# Patient Record
Sex: Male | Born: 1980 | Race: White | Hispanic: No | Marital: Single | Smoking: Former smoker
Health system: Southern US, Community
[De-identification: ages and names within clinical notes are randomized; demographics above are authoritative.]

## PROBLEM LIST (undated history)

## (undated) DIAGNOSIS — I10 Essential (primary) hypertension: Secondary | ICD-10-CM

## (undated) DIAGNOSIS — J4550 Severe persistent asthma, uncomplicated: Secondary | ICD-10-CM

## (undated) DIAGNOSIS — G4733 Obstructive sleep apnea (adult) (pediatric): Secondary | ICD-10-CM

## (undated) DIAGNOSIS — Z973 Presence of spectacles and contact lenses: Secondary | ICD-10-CM

## (undated) DIAGNOSIS — S83206A Unspecified tear of unspecified meniscus, current injury, right knee, initial encounter: Secondary | ICD-10-CM

## (undated) DIAGNOSIS — Z974 Presence of external hearing-aid: Secondary | ICD-10-CM

## (undated) DIAGNOSIS — J45909 Unspecified asthma, uncomplicated: Secondary | ICD-10-CM

## (undated) HISTORY — PX: WISDOM TOOTH EXTRACTION: SHX21

## (undated) HISTORY — PX: NO PAST SURGERIES: SHX2092

---
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
## 2019-04-08 ENCOUNTER — Other Ambulatory Visit: Payer: Self-pay

## 2019-04-08 DIAGNOSIS — Z20822 Contact with and (suspected) exposure to covid-19: Secondary | ICD-10-CM

## 2019-04-11 LAB — NOVEL CORONAVIRUS, NAA: SARS-CoV-2, NAA: NOT DETECTED

## 2019-04-12 ENCOUNTER — Telehealth: Payer: Self-pay | Admitting: General Practice

## 2019-04-12 NOTE — Telephone Encounter (Signed)
Negative COVID results given. Patient results "NOT Detected." Caller expressed understanding. ° °

## 2019-08-17 ENCOUNTER — Inpatient Hospital Stay (HOSPITAL_COMMUNITY): Payer: Managed Care, Other (non HMO)

## 2019-08-17 ENCOUNTER — Encounter (HOSPITAL_COMMUNITY): Payer: Self-pay

## 2019-08-17 ENCOUNTER — Emergency Department (HOSPITAL_COMMUNITY): Payer: Managed Care, Other (non HMO)

## 2019-08-17 ENCOUNTER — Inpatient Hospital Stay (HOSPITAL_COMMUNITY)
Admission: EM | Admit: 2019-08-17 | Discharge: 2019-08-23 | DRG: 202 | Disposition: A | Discharge status: Home / Self Care | Payer: Managed Care, Other (non HMO) | Attending: Internal Medicine | Admitting: Internal Medicine

## 2019-08-17 ENCOUNTER — Other Ambulatory Visit: Payer: Self-pay

## 2019-08-17 DIAGNOSIS — J189 Pneumonia, unspecified organism: Secondary | ICD-10-CM | POA: Diagnosis present

## 2019-08-17 DIAGNOSIS — R0602 Shortness of breath: Secondary | ICD-10-CM

## 2019-08-17 DIAGNOSIS — Z8709 Personal history of other diseases of the respiratory system: Secondary | ICD-10-CM

## 2019-08-17 DIAGNOSIS — Z6841 Body Mass Index (BMI) 40.0 and over, adult: Secondary | ICD-10-CM | POA: Diagnosis not present

## 2019-08-17 DIAGNOSIS — E873 Alkalosis: Secondary | ICD-10-CM | POA: Diagnosis present

## 2019-08-17 DIAGNOSIS — Z7951 Long term (current) use of inhaled steroids: Secondary | ICD-10-CM

## 2019-08-17 DIAGNOSIS — J4552 Severe persistent asthma with status asthmaticus: Principal | ICD-10-CM | POA: Diagnosis present

## 2019-08-17 DIAGNOSIS — Z87891 Personal history of nicotine dependence: Secondary | ICD-10-CM

## 2019-08-17 DIAGNOSIS — Z79899 Other long term (current) drug therapy: Secondary | ICD-10-CM | POA: Diagnosis not present

## 2019-08-17 DIAGNOSIS — Z8679 Personal history of other diseases of the circulatory system: Secondary | ICD-10-CM

## 2019-08-17 DIAGNOSIS — J9621 Acute and chronic respiratory failure with hypoxia: Secondary | ICD-10-CM | POA: Diagnosis present

## 2019-08-17 DIAGNOSIS — I878 Other specified disorders of veins: Secondary | ICD-10-CM | POA: Diagnosis present

## 2019-08-17 DIAGNOSIS — E662 Morbid (severe) obesity with alveolar hypoventilation: Secondary | ICD-10-CM | POA: Diagnosis present

## 2019-08-17 DIAGNOSIS — Z20822 Contact with and (suspected) exposure to covid-19: Secondary | ICD-10-CM | POA: Diagnosis present

## 2019-08-17 DIAGNOSIS — J9622 Acute and chronic respiratory failure with hypercapnia: Secondary | ICD-10-CM | POA: Diagnosis present

## 2019-08-17 DIAGNOSIS — I10 Essential (primary) hypertension: Secondary | ICD-10-CM | POA: Diagnosis present

## 2019-08-17 DIAGNOSIS — J9601 Acute respiratory failure with hypoxia: Secondary | ICD-10-CM | POA: Diagnosis not present

## 2019-08-17 DIAGNOSIS — J45909 Unspecified asthma, uncomplicated: Secondary | ICD-10-CM | POA: Diagnosis not present

## 2019-08-17 DIAGNOSIS — E669 Obesity, unspecified: Secondary | ICD-10-CM | POA: Diagnosis present

## 2019-08-17 DIAGNOSIS — E872 Acidosis: Secondary | ICD-10-CM | POA: Diagnosis present

## 2019-08-17 DIAGNOSIS — D751 Secondary polycythemia: Secondary | ICD-10-CM | POA: Diagnosis present

## 2019-08-17 DIAGNOSIS — I2781 Cor pulmonale (chronic): Secondary | ICD-10-CM | POA: Diagnosis present

## 2019-08-17 HISTORY — DX: Personal history of other diseases of the respiratory system: Z87.09

## 2019-08-17 HISTORY — DX: Essential (primary) hypertension: I10

## 2019-08-17 HISTORY — DX: Unspecified asthma, uncomplicated: J45.909

## 2019-08-17 HISTORY — DX: Personal history of other diseases of the circulatory system: Z86.79

## 2019-08-17 LAB — D-DIMER, QUANTITATIVE: D-Dimer, Quant: 0.47 ug/mL-FEU (ref 0.00–0.50)

## 2019-08-17 LAB — BLOOD GAS, ARTERIAL
Acid-Base Excess: 8.4 mmol/L — ABNORMAL HIGH (ref 0.0–2.0)
Bicarbonate: 38.7 mmol/L — ABNORMAL HIGH (ref 20.0–28.0)
Drawn by: 270211
FIO2: 44
O2 Saturation: 94.7 %
Patient temperature: 98.6
pCO2 arterial: 78 mmHg (ref 32.0–48.0)
pH, Arterial: 7.317 — ABNORMAL LOW (ref 7.350–7.450)
pO2, Arterial: 79.5 mmHg — ABNORMAL LOW (ref 83.0–108.0)

## 2019-08-17 LAB — CBC WITH DIFFERENTIAL/PLATELET
Abs Immature Granulocytes: 0.09 10*3/uL — ABNORMAL HIGH (ref 0.00–0.07)
Basophils Absolute: 0.1 10*3/uL (ref 0.0–0.1)
Basophils Relative: 1 %
Eosinophils Absolute: 0 10*3/uL (ref 0.0–0.5)
Eosinophils Relative: 0 %
HCT: 57.6 % — ABNORMAL HIGH (ref 39.0–52.0)
Hemoglobin: 17.6 g/dL — ABNORMAL HIGH (ref 13.0–17.0)
Immature Granulocytes: 1 %
Lymphocytes Relative: 16 %
Lymphs Abs: 1.8 10*3/uL (ref 0.7–4.0)
MCH: 33.7 pg (ref 26.0–34.0)
MCHC: 30.6 g/dL (ref 30.0–36.0)
MCV: 110.1 fL — ABNORMAL HIGH (ref 80.0–100.0)
Monocytes Absolute: 1.7 10*3/uL — ABNORMAL HIGH (ref 0.1–1.0)
Monocytes Relative: 15 %
Neutro Abs: 7.4 10*3/uL (ref 1.7–7.7)
Neutrophils Relative %: 67 %
Platelets: 189 10*3/uL (ref 150–400)
RBC: 5.23 MIL/uL (ref 4.22–5.81)
RDW: 15.5 % (ref 11.5–15.5)
WBC: 11 10*3/uL — ABNORMAL HIGH (ref 4.0–10.5)
nRBC: 1.5 % — ABNORMAL HIGH (ref 0.0–0.2)

## 2019-08-17 LAB — EXPECTORATED SPUTUM ASSESSMENT W GRAM STAIN, RFLX TO RESP C

## 2019-08-17 LAB — BRAIN NATRIURETIC PEPTIDE: B Natriuretic Peptide: 182.7 pg/mL — ABNORMAL HIGH (ref 0.0–100.0)

## 2019-08-17 LAB — HIV ANTIBODY (ROUTINE TESTING W REFLEX): HIV Screen 4th Generation wRfx: NONREACTIVE

## 2019-08-17 LAB — BASIC METABOLIC PANEL
Anion gap: 9 (ref 5–15)
BUN: 24 mg/dL — ABNORMAL HIGH (ref 6–20)
CO2: 34 mmol/L — ABNORMAL HIGH (ref 22–32)
Calcium: 9.3 mg/dL (ref 8.9–10.3)
Chloride: 94 mmol/L — ABNORMAL LOW (ref 98–111)
Creatinine, Ser: 1.11 mg/dL (ref 0.61–1.24)
GFR calc Af Amer: >60 mL/min (ref >60)
GFR calc non Af Amer: >60 mL/min (ref >60)
Glucose, Bld: 119 mg/dL — ABNORMAL HIGH (ref 70–99)
Potassium: 4.1 mmol/L (ref 3.5–5.1)
Sodium: 137 mmol/L (ref 135–145)

## 2019-08-17 LAB — POC SARS CORONAVIRUS 2 AG -  ED: SARS Coronavirus 2 Ag: NEGATIVE

## 2019-08-17 LAB — TSH: TSH: 1.725 u[IU]/mL (ref 0.350–4.500)

## 2019-08-17 LAB — ECHOCARDIOGRAM COMPLETE

## 2019-08-17 LAB — SARS CORONAVIRUS 2 (TAT 6-24 HRS): SARS Coronavirus 2: NEGATIVE

## 2019-08-17 LAB — PROCALCITONIN: Procalcitonin: 0.46 ng/mL

## 2019-08-17 MED ORDER — BUDESONIDE 0.5 MG/2ML IN SUSP
0.5000 mg | Freq: Two times a day (BID) | RESPIRATORY_TRACT | Status: DC
  Administered 2019-08-17: 0.5 mg via RESPIRATORY_TRACT
  Filled 2019-08-17: qty 2

## 2019-08-17 MED ORDER — IPRATROPIUM-ALBUTEROL 0.5-2.5 (3) MG/3ML IN SOLN
3.0000 mL | Freq: Four times a day (QID) | RESPIRATORY_TRACT | Status: DC
  Administered 2019-08-17 – 2019-08-18 (×6): 3 mL via RESPIRATORY_TRACT
  Filled 2019-08-17 (×6): qty 3

## 2019-08-17 MED ORDER — ACETAMINOPHEN 325 MG PO TABS
650.0000 mg | ORAL_TABLET | Freq: Four times a day (QID) | ORAL | Status: DC | PRN
  Administered 2019-08-18 – 2019-08-20 (×3): 650 mg via ORAL
  Filled 2019-08-17 (×3): qty 2

## 2019-08-17 MED ORDER — IRBESARTAN 300 MG PO TABS
300.0000 mg | ORAL_TABLET | Freq: Every day | ORAL | Status: DC
  Administered 2019-08-18: 300 mg via ORAL
  Filled 2019-08-17: qty 1

## 2019-08-17 MED ORDER — MAGNESIUM SULFATE 2 GM/50ML IV SOLN
2.0000 g | Freq: Once | INTRAVENOUS | Status: AC
  Administered 2019-08-17: 2 g via INTRAVENOUS
  Filled 2019-08-17: qty 50

## 2019-08-17 MED ORDER — METHYLPREDNISOLONE SODIUM SUCC 125 MG IJ SOLR
125.0000 mg | Freq: Once | INTRAMUSCULAR | Status: AC
  Administered 2019-08-17: 125 mg via INTRAVENOUS
  Filled 2019-08-17: qty 2

## 2019-08-17 MED ORDER — ALBUTEROL SULFATE HFA 108 (90 BASE) MCG/ACT IN AERS
8.0000 | INHALATION_SPRAY | Freq: Once | RESPIRATORY_TRACT | Status: AC
  Administered 2019-08-17: 8 via RESPIRATORY_TRACT
  Filled 2019-08-17: qty 6.7

## 2019-08-17 MED ORDER — ENOXAPARIN SODIUM 40 MG/0.4ML ~~LOC~~ SOLN
40.0000 mg | SUBCUTANEOUS | Status: DC
  Administered 2019-08-17 – 2019-08-22 (×6): 40 mg via SUBCUTANEOUS
  Filled 2019-08-17 (×6): qty 0.4

## 2019-08-17 MED ORDER — HYDROCHLOROTHIAZIDE 12.5 MG PO CAPS
12.5000 mg | ORAL_CAPSULE | Freq: Every day | ORAL | Status: DC
  Administered 2019-08-18: 12.5 mg via ORAL
  Filled 2019-08-17: qty 1

## 2019-08-17 MED ORDER — VALSARTAN-HYDROCHLOROTHIAZIDE 320-12.5 MG PO TABS: 1.0000 | ORAL_TABLET | Freq: Every day | ORAL | Status: DC

## 2019-08-17 MED ORDER — ONDANSETRON HCL 4 MG/2ML IJ SOLN: 4.0000 mg | Freq: Four times a day (QID) | INTRAMUSCULAR | Status: DC | PRN

## 2019-08-17 MED ORDER — SODIUM CHLORIDE 0.9 % IV SOLN
2.0000 g | INTRAVENOUS | Status: AC
  Administered 2019-08-17 – 2019-08-21 (×5): 2 g via INTRAVENOUS
  Filled 2019-08-17: qty 20
  Filled 2019-08-17 (×4): qty 2

## 2019-08-17 MED ORDER — ONDANSETRON HCL 4 MG PO TABS: 4.0000 mg | ORAL_TABLET | Freq: Four times a day (QID) | ORAL | Status: DC | PRN

## 2019-08-17 MED ORDER — ACETAMINOPHEN 650 MG RE SUPP: 650.0000 mg | Freq: Four times a day (QID) | RECTAL | Status: DC | PRN

## 2019-08-17 MED ORDER — AMLODIPINE BESYLATE 10 MG PO TABS
10.0000 mg | ORAL_TABLET | Freq: Every day | ORAL | Status: DC
  Administered 2019-08-18: 10 mg via ORAL
  Filled 2019-08-17: qty 1

## 2019-08-17 MED ORDER — AZITHROMYCIN 250 MG PO TABS
500.0000 mg | ORAL_TABLET | Freq: Every day | ORAL | Status: AC
  Administered 2019-08-17 – 2019-08-21 (×5): 500 mg via ORAL
  Filled 2019-08-17 (×5): qty 2

## 2019-08-17 MED ORDER — AEROCHAMBER PLUS FLO-VU MEDIUM MISC
1.0000 | Freq: Once | Status: AC
  Administered 2019-08-17: 1
  Filled 2019-08-17: qty 1

## 2019-08-17 MED ORDER — BUDESONIDE 0.5 MG/2ML IN SUSP
2.0000 mg | Freq: Four times a day (QID) | RESPIRATORY_TRACT | Status: DC
  Administered 2019-08-17: 15:00:00 2 mg via RESPIRATORY_TRACT
  Filled 2019-08-17: qty 8

## 2019-08-17 MED ORDER — METHYLPREDNISOLONE SODIUM SUCC 125 MG IJ SOLR
60.0000 mg | Freq: Four times a day (QID) | INTRAMUSCULAR | Status: DC
  Administered 2019-08-17 – 2019-08-18 (×3): 60 mg via INTRAVENOUS
  Filled 2019-08-17 (×3): qty 2

## 2019-08-17 MED ORDER — ALBUTEROL SULFATE HFA 108 (90 BASE) MCG/ACT IN AERS
8.0000 | INHALATION_SPRAY | Freq: Once | RESPIRATORY_TRACT | Status: AC
  Administered 2019-08-17: 14:00:00 8 via RESPIRATORY_TRACT

## 2019-08-17 MED ORDER — SENNOSIDES-DOCUSATE SODIUM 8.6-50 MG PO TABS: 1.0000 | ORAL_TABLET | Freq: Every evening | ORAL | Status: DC | PRN

## 2019-08-17 MED ORDER — IPRATROPIUM BROMIDE HFA 17 MCG/ACT IN AERS
2.0000 | INHALATION_SPRAY | Freq: Once | RESPIRATORY_TRACT | Status: AC
  Administered 2019-08-17: 2 via RESPIRATORY_TRACT
  Filled 2019-08-17: qty 12.9

## 2019-08-17 MED ORDER — ATENOLOL 25 MG PO TABS
25.0000 mg | ORAL_TABLET | Freq: Every day | ORAL | Status: DC
  Administered 2019-08-17 – 2019-08-18 (×2): 25 mg via ORAL
  Filled 2019-08-17 (×2): qty 1

## 2019-08-17 NOTE — ED Provider Notes (Signed)
[facility]-EMERGENCY DEPT Provider Note   CSN: [id number] Arrival date & time: 08/17/19  1147     History Chief Complaint  Patient presents with  . Shortness of Breath    Joseph FreestoneJeffrey L [name] is a 39 y.o. male who presents emergency department with shortness of breath.  He has a history of asthma and tobacco abuse.  Patient quit smoking about 10 years ago.  Patient states he has trouble breathing all the time however over the past few days he has had worsening in his breathing.  He states that he normally has oxygen saturations in the 80s at baseline.  He is not on home oxygen but does not have a pulmonologist.  He has been using his nebulizer at home without significant relief and today noticed that his oxygen saturation was in the 60s so came to the ER.  He denies fevers or chills.  He has never been hospitalized or intubated.  He has seasonal allergies and states that it usually worsens his breathing during season changes.  He states he is taking something for his allergies.  He denies unilateral leg swelling, calf pain, history of DVT.  HPI     Past Medical History:  Diagnosis Date  . Asthma   . Hypertension     Patient Active Problem List   Diagnosis Date Noted  . Acute respiratory failure with hypoxia (HCC) 08/17/2019  . Obesity 08/17/2019  . Essential hypertension 08/17/2019    History reviewed. No pertinent surgical history.     History reviewed. No pertinent family history.  Social History   Tobacco Use  . Smoking status: Never Smoker  . Smokeless tobacco: Never Used  Substance Use Topics  . Alcohol use: Not on file  . Drug use: Not on file    Home Medications Prior to Admission medications   Medication Sig Start Date End Date Taking? Authorizing Provider  ADVAIR DISKUS 250-50 MCG/DOSE AEPB Inhale 1 puff into the lungs 2 (two) times daily. 08/12/19  Yes [provider]  albuterol (VENTOLIN HFA) 108 (90 Base) MCG/ACT inhaler Inhale  2 puffs into the lungs every 6 (six) hours as needed for wheezing or shortness of breath. 08/14/19  Yes [provider]  amLODipine (NORVASC) 10 MG tablet Take 10 mg by mouth daily. 08/11/19  Yes [provider]  atenolol (TENORMIN) 25 MG tablet Take 25 mg by mouth daily. 08/09/19  Yes [provider]  guaiFENesin (MUCINEX) 600 MG 12 hr tablet Take 600 mg by mouth 2 (two) times daily as needed for cough or to loosen phlegm.   Yes [provider]  valsartan-hydrochlorothiazide (DIOVAN-HCT) 320-12.5 MG tablet Take 1 tablet by mouth daily. 08/09/19  Yes [provider]    Allergies    Patient has no known allergies.  Review of Systems   Review of Systems Ten systems reviewed and are negative for acute change, except as noted in the HPI.   Physical Exam Updated Vital Signs BP (!) 142/70 (BP Location: Left Arm)   Pulse 81   Temp 100.3 F (37.9 C) (Oral)   Resp 20   Ht 5\' 7"  (1.702 m)   Wt 123.8 kg   SpO2 97%   BMI 42.76 kg/m   Physical Exam Vitals and nursing note reviewed.  Constitutional:      General: He is not in acute distress.    Appearance: He is well-developed. He is obese. He is not diaphoretic.  HENT:     Head: Normocephalic and  atraumatic.  Eyes:     General: No scleral icterus.    Conjunctiva/sclera: Conjunctivae normal.  Cardiovascular:     Rate and Rhythm: Normal rate and regular rhythm.     Heart sounds: Normal heart sounds.  Pulmonary:     Effort: Pulmonary effort is normal. No respiratory distress.     Breath sounds: Normal breath sounds.  Abdominal:     Palpations: Abdomen is soft.     Tenderness: There is no abdominal tenderness.  Musculoskeletal:     Cervical back: Normal range of motion and neck supple.  Skin:    General: Skin is warm and dry.  Neurological:     Mental Status: He is alert.  Psychiatric:        Behavior: Behavior normal.     ED Results / Procedures / Treatments   Labs (all labs ordered  are listed, but only abnormal results are displayed) Labs Reviewed  BASIC METABOLIC PANEL - Abnormal; Notable for the following components:      Result Value   Chloride 94 (*)    CO2 34 (*)    Glucose, Bld 119 (*)    BUN 24 (*)    All other components within normal limits  CBC WITH DIFFERENTIAL/PLATELET - Abnormal; Notable for the following components:   WBC 11.0 (*)    Hemoglobin 17.6 (*)    HCT 57.6 (*)    MCV 110.1 (*)    nRBC 1.5 (*)    Monocytes Absolute 1.7 (*)    Abs Immature Granulocytes 0.09 (*)    All other components within normal limits  BRAIN NATRIURETIC PEPTIDE - Abnormal; Notable for the following components:   B Natriuretic Peptide 182.7 (*)    All other components within normal limits  BLOOD GAS, ARTERIAL - Abnormal; Notable for the following components:   pH, Arterial 7.317 (*)    pCO2 arterial 78.0 (*)    pO2, Arterial 79.5 (*)    Bicarbonate 38.7 (*)    Acid-Base Excess 8.4 (*)    All other components within normal limits  SARS CORONAVIRUS 2 (TAT 6-24 HRS)  EXPECTORATED SPUTUM ASSESSMENT W REFEX TO RESP CULTURE  D-DIMER, QUANTITATIVE (NOT AT [facility])  HIV ANTIBODY (ROUTINE TESTING W REFLEX)  TSH  PROCALCITONIN  CBC  BASIC METABOLIC PANEL  MAGNESIUM  PROCALCITONIN  BLOOD GAS, ARTERIAL  POC SARS CORONAVIRUS 2 AG -  ED    EKG EKG Interpretation  Date/Time:  Wednesday August 17 2019 12:05:02 EDT Ventricular Rate:  86 PR Interval:    QRS Duration: 100 QT Interval:  369 QTC Calculation: 442 R Axis:   -146 Text Interpretation: Sinus rhythm Probable left atrial enlargement Consider right ventricular hypertrophy NO STEMI Confirmed by Alvester Chou [phone]) on 08/17/2019 12:32:48 PM   Radiology DG Chest Port 1 View  Result Date: 08/17/2019 CLINICAL DATA:  Increased shortness of breath. Difficulty breathing for 2 days. Decreased oxygen saturation. EXAM: PORTABLE CHEST 1 VIEW COMPARISON:  None. FINDINGS: Mild cardiomegaly. Bilateral peribronchial  thickening with patchy airspace disease in the right perihilar and infrahilar lung zone. No significant pleural effusion. No visualized pneumothorax. No acute osseous abnormalities are seen. IMPRESSION: 1. Mild cardiomegaly. Peribronchial thickening likely related to history of asthma, pulmonary edema felt less likely. 2. Patchy airspace disease in the right perihilar and infrahilar lung may be atelectasis or pneumonia. Electronically Signed   By: Narda Rutherford M.D.   On: 08/17/2019 13:03   ECHOCARDIOGRAM COMPLETE  Result Date: 08/17/2019    ECHOCARDIOGRAM REPORT  Patient Name:   Joseph [name] [facility] Date of Exam: 08/17/2019 Medical Rec #:  [id number]        Height:       67.0 in Accession #:    [id number]       Weight:       280.0 lb Date of Birth:  1980-10-24        BSA:          2.333 m Patient Age:    39 years         BP:           124/69 mmHg Patient Gender: M                HR:           94 bpm. Exam Location:  Inpatient Procedure: 2D Echo Indications:    dyspnea 786.09  History:        Patient has no prior history of Echocardiogram examinations.                 Risk Factors:Hypertension.  Sonographer:    Jannett Celestine RDCS (AE) Referring Phys: [id number] ERIC J [place] IMPRESSIONS  1. Left ventricular ejection fraction, by estimation, is 60 to 65%. The left ventricle has normal function. The left ventricle has no regional wall motion abnormalities. Left ventricular diastolic parameters were normal.  2. Right ventricular systolic function is normal. The right ventricular size is normal.  3. The mitral valve is normal in structure. No evidence of mitral valve regurgitation. No evidence of mitral stenosis.  4. The aortic valve is tricuspid. Aortic valve regurgitation is not visualized. No aortic stenosis is present.  5. Shadowing artifact seen.  6. The inferior vena cava is normal in size with greater than 50% respiratory variability, suggesting right atrial pressure of 3 mmHg. FINDINGS  Left Ventricle: Left  ventricular ejection fraction, by estimation, is 60 to 65%. The left ventricle has normal function. The left ventricle has no regional wall motion abnormalities. The left ventricular internal cavity size was normal in size. There is  no left ventricular hypertrophy. Left ventricular diastolic parameters were normal. Right Ventricle: The right ventricular size is normal. No increase in right ventricular wall thickness. Right ventricular systolic function is normal. Left Atrium: Left atrial size was normal in size. Right Atrium: Right atrial size was normal in size. Pericardium: There is no evidence of pericardial effusion. Mitral Valve: The mitral valve is normal in structure. There is mild thickening of the mitral valve leaflet(s). There is mild calcification of the mitral valve leaflet(s). Normal mobility of the mitral valve leaflets. Mild mitral annular calcification. No evidence of mitral valve regurgitation. No evidence of mitral valve stenosis. Tricuspid Valve: The tricuspid valve is normal in structure. Tricuspid valve regurgitation is trivial. No evidence of tricuspid stenosis. Aortic Valve: The aortic valve is tricuspid. Aortic valve regurgitation is not visualized. No aortic stenosis is present. Pulmonic Valve: The pulmonic valve was normal in structure. Pulmonic valve regurgitation is not visualized. No evidence of pulmonic stenosis. Aorta: Shadowing artifact seen. The aortic root is normal in size and structure. Venous: The inferior vena cava is normal in size with greater than 50% respiratory variability, suggesting right atrial pressure of 3 mmHg. IAS/Shunts: The interatrial septum was not well visualized.  LEFT VENTRICLE PLAX 2D LVIDd:         5.60 cm  Diastology LVIDs:         3.30 cm  LV e' lateral:   11.20 cm/s  LV PW:         1.00 cm  LV E/e' lateral: 11.7 LV IVS:        1.00 cm  LV e' medial:    7.72 cm/s LVOT diam:     2.50 cm  LV E/e' medial:  17.0 LV SV:         102 LV SV Index:   44 LVOT Area:      4.91 cm  LEFT ATRIUM           Index LA diam:      4.40 cm 1.89 cm/m LA Vol (A2C): 54.7 ml 23.45 ml/m  AORTIC VALVE LVOT Vmax:   107.00 cm/s LVOT Vmean:  75.600 cm/s LVOT VTI:    0.207 m  AORTA Ao Root diam: 3.30 cm MITRAL VALVE MV Area (PHT): 3.42 cm     SHUNTS MV Decel Time: 222 msec     Systemic VTI:  0.21 m MV E velocity: 131.00 cm/s  Systemic Diam: 2.50 cm MV A velocity: 88.10 cm/s MV E/A ratio:  1.49 Charlton Haws MD Electronically signed by Charlton Haws MD Signature Date/Time: 08/17/2019/4:21:14 PM    Final     Procedures .Critical Care Performed by: Arthor Captain, PA-C Authorized by: Arthor Captain, PA-C   Critical care provider statement:    Critical care time (minutes):  45   Critical care time was exclusive of:  Separately billable procedures and treating other patients   Critical care was necessary to treat or prevent imminent or life-threatening deterioration of the following conditions:  Respiratory failure   Critical care was time spent personally by me on the following activities:  Discussions with consultants, evaluation of patient's response to treatment, examination of patient, ordering and performing treatments and interventions, ordering and review of laboratory studies, ordering and review of radiographic studies, pulse oximetry, re-evaluation of patient's condition, obtaining history from patient or surrogate and review of old charts   (including critical care time)  Medications Ordered in ED Medications  enoxaparin (LOVENOX) injection 40 mg (has no administration in time range)  acetaminophen (TYLENOL) tablet 650 mg (has no administration in time range)    Or  acetaminophen (TYLENOL) suppository 650 mg (has no administration in time range)  senna-docusate (Senokot-S) tablet 1 tablet (has no administration in time range)  ondansetron (ZOFRAN) tablet 4 mg (has no administration in time range)    Or  ondansetron (ZOFRAN) injection 4 mg (has no administration in  time range)  cefTRIAXone (ROCEPHIN) 2 g in sodium chloride 0.9 % 100 mL IVPB (2 g Intravenous New Bag/Given (Non-Interop) 08/17/19 1607)  azithromycin (ZITHROMAX) tablet 500 mg (500 mg Oral Given 08/17/19 1429)  ipratropium-albuterol (DUONEB) 0.5-2.5 (3) MG/3ML nebulizer solution 3 mL (3 mLs Nebulization Given 08/17/19 2001)  methylPREDNISolone sodium succinate (SOLU-MEDROL) 125 mg/2 mL injection 60 mg (60 mg Intravenous Given 08/17/19 1735)  budesonide (PULMICORT) nebulizer solution 0.5 mg (0.5 mg Nebulization Given 08/17/19 2001)  amLODipine (NORVASC) tablet 10 mg (has no administration in time range)  atenolol (TENORMIN) tablet 25 mg (25 mg Oral Given 08/17/19 1603)  irbesartan (AVAPRO) tablet 300 mg (has no administration in time range)  hydrochlorothiazide (MICROZIDE) capsule 12.5 mg (has no administration in time range)  methylPREDNISolone sodium succinate (SOLU-MEDROL) 125 mg/2 mL injection 125 mg (125 mg Intravenous Given 08/17/19 1232)  magnesium sulfate IVPB 2 g 50 mL (0 g Intravenous Stopped 08/17/19 1337)  AeroChamber Plus Flo-Vu Medium MISC 1 each (1 each Other Given 08/17/19 1231)  albuterol (VENTOLIN HFA)  108 (90 Base) MCG/ACT inhaler 8 puff (8 puffs Inhalation Given 08/17/19 1231)  ipratropium (ATROVENT HFA) inhaler 2 puff (2 puffs Inhalation Given 08/17/19 1232)  albuterol (VENTOLIN HFA) 108 (90 Base) MCG/ACT inhaler 8 puff (8 puffs Inhalation Given 08/17/19 1341)    ED Course  I have reviewed the triage vital signs and the nursing notes.  Pertinent labs & imaging results that were available during my care of the patient were reviewed by me and considered in my medical decision making (see chart for details).  Clinical Course as of Aug 16 2057  Wed Aug 17, 2019  2527 39 year old male with a history of asthma quit smoking 10 years ago presenting to the emergency department with wheezing shortness of breath.  Reports his symptoms began gradually about 2 days ago.  He says it feels like  his asthma.  He only has an albuterol inhaler no nebulizer at home.  Has been trying to use every 4-6 hours with minimal relief.  Says he works in a [employer] house [employer] and spends most of the day sitting.  He is not sure what his trigger may be.  On arrival in the ED he was originally hypoxic with an O2 sat of 60%, which improved to 95% on 4 L nasal cannula.  He is comfortable on exam and speaking full sentences.  He does have diffuse bilateral inspiratory and expiratory wheezing.  There is no retractions.  No signs of impending respiratory failure.  Pending a rapid Covid test, patient will need DuoNeb treatments.  Also ordered steroids and magnesium.  No signs or symptoms of pulmonary embolism.   [MT]  1339 Admitted to hospitalist   [MT]    Clinical Course User Index [MT] Terald Sleeper, MD   MDM Rules/Calculators/A&P                      39 year old male with chronic hypoxic respiratory failure patient states that he always has oxygen saturations in the 80s. The emergent differential diagnosis for shortness of breath includes, but is not limited to, Pulmonary edema, bronchoconstriction, Pneumonia, Pulmonary embolism, Pneumotherax/ Hemothorax, Dysrythmia, ACS.  He does not see a pulmonologist and does not have home oxygen. He this is due to persistent severe asthma. Patient is on Advair but states he feels like it does not work very well. Patient had worsening in his symptoms and came in with severe labored breathing and respiratory distress, tight wheezing and prolonged expiratory phase. His initial oxygen saturation 68% on room air requiring 4 L via nasal cannula with improvement. Blood glass shows combined hypoxic and hypercarbic respiratory failure which is uncompensated. BNP mildly elevated. BMP shows slightly elevated blood glucose and BUN. CBC shows an elevated white blood cell count. Initial point-of-care Covid is negative however his definitive PCR test is pending. Patient treated with  multiple rounds of albuterol without significant improvement. He was given Solu-Medrol, magnesium, ipratropium without much effect. Patient given multiple doses. He remains in status asthmaticus with acute respiratory failure. I have reviewed the patient's EKG which showed sinus rhythm at a rate of 86. Patient will need admission. The patient's one view chest x-ray which shows questionable perihilar infiltrates versus atelectasis.   MALEKO GREULICH was evaluated in Emergency Department on 08/17/2019 for the symptoms described in the history of present illness. He was evaluated in the context of the global COVID-19 pandemic, which necessitated consideration that the patient might be at risk for infection with the SARS-CoV-2 virus that causes COVID-19.  Institutional protocols and algorithms that pertain to the evaluation of patients at risk for COVID-19 are in a state of rapid change based on information released by regulatory bodies including the CDC and federal and state organizations. These policies and algorithms were followed during the patient's care in the ED.       Final Clinical Impression(s) / ED Diagnoses Final diagnoses:  Severe persistent asthma with status asthmaticus  Acute on chronic respiratory failure with hypoxia and hypercapnia [facility])    Rx / DC Orders ED Discharge Orders    None       Arthor Captain, PA-C 08/17/19 2103    Terald Sleeper, MD 08/18/19 [phone]

## 2019-08-17 NOTE — ED Notes (Signed)
ED TO INPATIENT HANDOFF REPORT  Name/Age/Gender Joseph [name] 39 y.o. male  Code Status    Code Status Orders  (From admission, onward)         Start     Ordered   08/17/19 1406  Full code  Continuous     08/17/19 1405        Code Status History    This patient has a current code status but no historical code status.   Advance Care Planning Activity      Home/SNF/Other Home  Chief Complaint Acute respiratory failure with hypoxia (HCC) [J96.01]  Level of Care/Admitting Diagnosis ED Disposition    ED Disposition Condition Comment   Admit  Hospital Area: [facility] [[id number]]  Level of Care: Telemetry [5]  Admit to tele based on following criteria: Monitor for Ischemic changes  May admit patient to [facility] or [facility] if equivalent level of care is available:: Yes  Covid Evaluation: Symptomatic Person Under Investigation (PUI)  Diagnosis: Acute respiratory failure with hypoxia [facility]) [[id number]]  Admitting Physician: [place] ERIC J [[id number]]  Attending Physician: [place] ERIC J [[id number]]  Estimated length of stay: past midnight tomorrow  Certification:: I certify this patient will need inpatient services for at least 2 midnights       Medical History Past Medical History:  Diagnosis Date  . Asthma   . Hypertension     Allergies No Known Allergies  IV Location/Drains/Wounds Patient Lines/Drains/Airways Status   Active Line/Drains/Airways    Name:   Placement date:   Placement time:   Site:   Days:   Peripheral IV 08/17/19 Right Antecubital   08/17/19    1210    Antecubital   less than 1          Labs/Imaging Results for orders placed or performed during the hospital encounter of 08/17/19 (from the past 48 hour(s))  Basic metabolic panel     Status: Abnormal   Collection Time: 08/17/19 12:19 PM  Result Value Ref Range   Sodium 137 135 - 145 mmol/L   Potassium 4.1 3.5 - 5.1 mmol/L   Chloride 94 (L) 98 - 111 mmol/L   CO2  34 (H) 22 - 32 mmol/L   Glucose, Bld 119 (H) 70 - 99 mg/dL    Comment: Glucose reference range applies only to samples taken after fasting for at least 8 hours.   BUN 24 (H) 6 - 20 mg/dL   Creatinine, Ser 1.11 0.61 - 1.24 mg/dL   Calcium 9.3 8.9 - 10.3 mg/dL   GFR calc non Af Amer >60 >60 mL/min   GFR calc Af Amer >60 >60 mL/min   Anion gap 9 5 - 15    Comment: Performed at [facility], [place] [address] [place], [place] [address]  CBC with Differential/Platelet     Status: Abnormal   Collection Time: 08/17/19 12:19 PM  Result Value Ref Range   WBC 11.0 (H) 4.0 - 10.5 K/uL   RBC 5.23 4.22 - 5.81 MIL/uL   Hemoglobin 17.6 (H) 13.0 - 17.0 g/dL   HCT 57.6 (H) 39.0 - 52.0 %   MCV 110.1 (H) 80.0 - 100.0 fL   MCH 33.7 26.0 - 34.0 pg   MCHC 30.6 30.0 - 36.0 g/dL   RDW 15.5 11.5 - 15.5 %   Platelets 189 150 - 400 K/uL   nRBC 1.5 (H) 0.0 - 0.2 %   Neutrophils Relative % 67 %   Neutro Abs 7.4  1.7 - 7.7 K/uL   Lymphocytes Relative 16 %   Lymphs Abs 1.8 0.7 - 4.0 K/uL   Monocytes Relative 15 %   Monocytes Absolute 1.7 (H) 0.1 - 1.0 K/uL   Eosinophils Relative 0 %   Eosinophils Absolute 0.0 0.0 - 0.5 K/uL   Basophils Relative 1 %   Basophils Absolute 0.1 0.0 - 0.1 K/uL   Immature Granulocytes 1 %   Abs Immature Granulocytes 0.09 (H) 0.00 - 0.07 K/uL    Comment: Performed at [facility], [place] [address], [place], [place] [address]  Brain natriuretic peptide     Status: Abnormal   Collection Time: 08/17/19 12:21 PM  Result Value Ref Range   B Natriuretic Peptide 182.7 (H) 0.0 - 100.0 pg/mL    Comment: Performed at [facility], [place] [address], [place], [place] [address]  Blood gas, arterial     Status: Abnormal   Collection Time: 08/17/19 12:30 PM  Result Value Ref Range   FIO2 44.00    Delivery systems NASAL CANNULA    pH, Arterial 7.317 (L) 7.350 - 7.450   pCO2 arterial 78.0 (HH) 32.0 - 48.0 mmHg    Comment: CRITICAL RESULT  CALLED TO, READ BACK BY AND VERIFIED WITH: BINGHAM,S. RN AT 2836 08/17/19 MULLINS,T    pO2, Arterial 79.5 (L) 83.0 - 108.0 mmHg   Bicarbonate 38.7 (H) 20.0 - 28.0 mmol/L   Acid-Base Excess 8.4 (H) 0.0 - 2.0 mmol/L   O2 Saturation 94.7 %   Patient temperature 98.6    Collection site RIGHT RADIAL    Drawn by [id number]    Allens test (pass/fail) PASS PASS    Comment: Performed at [facility], [place] [address] [place],  [address]  POC SARS Coronavirus 2 Ag-ED - Nasal Swab (BD Veritor Kit)     Status: None   Collection Time: 08/17/19  1:12 PM  Result Value Ref Range   SARS Coronavirus 2 Ag NEGATIVE NEGATIVE    Comment: (NOTE) SARS-CoV-2 antigen NOT DETECTED.  Negative results are presumptive.  Negative results do not preclude SARS-CoV-2 infection and should not be used as the sole basis for treatment or other patient management decisions, including infection  control decisions, particularly in the presence of clinical signs and  symptoms consistent with COVID-19, or in those who have been in contact with the virus.  Negative results must be combined with clinical observations, patient history, and epidemiological information. The expected result is Negative. Fact Sheet for Patients: PodPark.tn Fact Sheet for Healthcare Providers: GiftContent.is This test is not yet approved or cleared by the [place] FDA and  has been authorized for detection and/or diagnosis of SARS-CoV-2 by FDA under an Emergency Use Authorization (EUA).  This EUA will remain in effect (meaning this test can be used) for the duration of  the COVID-19 de claration under Section 564(b)(1) of the Act, 21 U.S.C. section 360bbb-3(b)(1), unless the authorization is terminated or revoked sooner.   D-dimer, quantitative (not at [facility])     Status: None   Collection Time: 08/17/19  2:06 PM  Result Value Ref Range   D-Dimer, Quant 0.47 0.00  - 0.50 ug/mL-FEU    Comment: (NOTE) At the manufacturer cut-off of 0.50 ug/mL FEU, this assay has been documented to exclude PE with a sensitivity and negative predictive value of 97 to 99%.  At this time, this assay has not been approved by the FDA to exclude DVT/VTE. Results should be correlated with clinical  presentation. Performed at [facility], [place] [address], [place], [place] [address]   TSH     Status: None   Collection Time: 08/17/19  2:06 PM  Result Value Ref Range   TSH 1.725 0.350 - 4.500 uIU/mL    Comment: Performed by a 3rd Generation assay with a functional sensitivity of <=0.01 uIU/mL. Performed at [facility], [place] [address] [place], [place] [address]   Procalcitonin - Baseline     Status: None   Collection Time: 08/17/19  2:06 PM  Result Value Ref Range   Procalcitonin 0.46 ng/mL    Comment:        Interpretation: PCT (Procalcitonin) <= 0.5 ng/mL: Systemic infection (sepsis) is not likely. Local bacterial infection is possible. (NOTE)       Sepsis PCT Algorithm           Lower Respiratory Tract                                      Infection PCT Algorithm    ----------------------------     ----------------------------         PCT < 0.25 ng/mL                PCT < 0.10 ng/mL         Strongly encourage             Strongly discourage   discontinuation of antibiotics    initiation of antibiotics    ----------------------------     -----------------------------       PCT 0.25 - 0.50 ng/mL            PCT 0.10 - 0.25 ng/mL               OR       >80% decrease in PCT            Discourage initiation of                                            antibiotics      Encourage discontinuation           of antibiotics    ----------------------------     -----------------------------         PCT >= 0.50 ng/mL              PCT 0.26 - 0.50 ng/mL               AND        <80% decrease in PCT             Encourage initiation of                                              antibiotics       Encourage continuation           of antibiotics    ----------------------------     -----------------------------        PCT >= 0.50 ng/mL                  PCT > 0.50 ng/mL  AND         increase in PCT                  Strongly encourage                                      initiation of antibiotics    Strongly encourage escalation           of antibiotics                                     -----------------------------                                           PCT <= 0.25 ng/mL                                                 OR                                        > 80% decrease in PCT                                     Discontinue / Do not initiate                                             antibiotics Performed at [place] [address], [place], [place] [address]    DG Chest Port 1 View  Result Date: 08/17/2019 CLINICAL DATA:  Increased shortness of breath. Difficulty breathing for 2 days. Decreased oxygen saturation. EXAM: PORTABLE CHEST 1 VIEW COMPARISON:  None. FINDINGS: Mild cardiomegaly. Bilateral peribronchial thickening with patchy airspace disease in the right perihilar and infrahilar lung zone. No significant pleural effusion. No visualized pneumothorax. No acute osseous abnormalities are seen. IMPRESSION: 1. Mild cardiomegaly. Peribronchial thickening likely related to history of asthma, pulmonary edema felt less likely. 2. Patchy airspace disease in the right perihilar and infrahilar lung may be atelectasis or pneumonia. Electronically Signed   By: Keith Rake M.D.   On: 08/17/2019 13:03    Pending Labs Unresulted Labs (From admission, onward)    Start     Ordered   08/24/19 0500  Creatinine, serum  (enoxaparin (LOVENOX)    CrCl >/= 30 ml/min)  Weekly,   R    Comments: while on enoxaparin therapy    08/17/19 1405   08/18/19 0500  CBC  Daily,   R     08/17/19 1405    08/18/19 9381  Basic metabolic panel  Daily,   R     08/17/19 1405   08/18/19 0500  Magnesium  Daily,   R     08/17/19 1405   08/18/19 0500  Procalcitonin  Daily,   R  08/17/19 1405   08/18/19 0500  Blood gas, arterial  Tomorrow morning,   R     08/17/19 1457   08/17/19 1406  HIV Antibody (routine testing w rflx)  (HIV Antibody (Routine testing w reflex) panel)  Once,   STAT     08/17/19 1405   08/17/19 1406  Culture, sputum-assessment  Once,   R     08/17/19 1405   08/17/19 1328  SARS CORONAVIRUS 2 (TAT 6-24 HRS) Nasopharyngeal Nasopharyngeal Swab  (Tier 3 (TAT 6-24 hrs))  Once,   STAT    Question Answer Comment  Is this test for diagnosis or screening Screening   Symptomatic for COVID-19 as defined by CDC No   Hospitalized for COVID-19 No   Admitted to ICU for COVID-19 No   Previously tested for COVID-19 Yes   Resident in a congregate (group) care setting No   Employed in healthcare setting No      08/17/19 1328          Vitals/Pain Today's Vitals   08/17/19 1445 08/17/19 1500 08/17/19 1530 08/17/19 1603  BP:  136/85 124/69 132/80  Pulse: 87 83 94 87  Resp: 16 (!) 25 17 (!) 23  Temp:      TempSrc:      SpO2: 98% 94% 95% 96%  PainSc:        Isolation Precautions Airborne and Contact precautions  Medications Medications  enoxaparin (LOVENOX) injection 40 mg (has no administration in time range)  acetaminophen (TYLENOL) tablet 650 mg (has no administration in time range)    Or  acetaminophen (TYLENOL) suppository 650 mg (has no administration in time range)  senna-docusate (Senokot-S) tablet 1 tablet (has no administration in time range)  ondansetron (ZOFRAN) tablet 4 mg (has no administration in time range)    Or  ondansetron (ZOFRAN) injection 4 mg (has no administration in time range)  cefTRIAXone (ROCEPHIN) 2 g in sodium chloride 0.9 % 100 mL IVPB (2 g Intravenous New Bag/Given (Non-Interop) 08/17/19 1607)  azithromycin (ZITHROMAX) tablet 500 mg (500 mg Oral  Given 08/17/19 1429)  ipratropium-albuterol (DUONEB) 0.5-2.5 (3) MG/3ML nebulizer solution 3 mL (3 mLs Nebulization Given 08/17/19 1430)  methylPREDNISolone sodium succinate (SOLU-MEDROL) 125 mg/2 mL injection 60 mg (0 mg Intravenous Hold 08/17/19 1442)  budesonide (PULMICORT) nebulizer solution 0.5 mg (has no administration in time range)  amLODipine (NORVASC) tablet 10 mg (has no administration in time range)  atenolol (TENORMIN) tablet 25 mg (25 mg Oral Given 08/17/19 1603)  irbesartan (AVAPRO) tablet 300 mg (has no administration in time range)  hydrochlorothiazide (MICROZIDE) capsule 12.5 mg (has no administration in time range)  methylPREDNISolone sodium succinate (SOLU-MEDROL) 125 mg/2 mL injection 125 mg (125 mg Intravenous Given 08/17/19 1232)  magnesium sulfate IVPB 2 g 50 mL (0 g Intravenous Stopped 08/17/19 1337)  AeroChamber Plus Flo-Vu Medium MISC 1 each (1 each Other Given 08/17/19 1231)  albuterol (VENTOLIN HFA) 108 (90 Base) MCG/ACT inhaler 8 puff (8 puffs Inhalation Given 08/17/19 1231)  ipratropium (ATROVENT HFA) inhaler 2 puff (2 puffs Inhalation Given 08/17/19 1232)  albuterol (VENTOLIN HFA) 108 (90 Base) MCG/ACT inhaler 8 puff (8 puffs Inhalation Given 08/17/19 1341)    Mobility walks

## 2019-08-17 NOTE — ED Notes (Signed)
Unable to ambulate pt due to increased [facility] with movement

## 2019-08-17 NOTE — ED Triage Notes (Signed)
Pt reports SHOB over the last few days. Pt reports checking his SpO2 and it read 61%. Pt reports hx of asthma. Pt denies having to wear home oxygen.

## 2019-08-17 NOTE — Progress Notes (Signed)
  Echocardiogram 2D Echocardiogram has been performed.  Celene Skeen 08/17/2019, 3:53 PM

## 2019-08-17 NOTE — H&P (Addendum)
History and Physical    Joseph [name] JXB:[id number] DOB: [birth year] DOA: 08/17/2019  PCP: Catha Gosselin, MD  Patient coming from: Home  I have personally briefly reviewed patient's old medical records in [facility]  Chief Complaint: Shortness of breath  HPI: Joseph [name] is a 39 y.o. male with medical history significant of asthma, essential hypertension, obesity presents from home with 2-day history of worsening shortness of breath.  Patient states checked his oxygen levels at home which were notable for 61% on room air.  He is not oxygen dependent at baseline.  He reports been using his albuterol inhaler quite frequently during this timeframe.  Denies any sick contacts.  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  He has yet to receive his Covid-19 vaccination.  Has required hospitalization in the past for asthma exacerbation, no previous intubation.  Also endorses productive cough of yellow sputum.  No changes in his home medication regimen.  No other complaints at this time.  ED Course: Temperature 99.4, HR 82, RR 20, BP 123/78, SPO2 60% on room air (96% on 4L [place] - not on home O2), WBC count 11.0, hemoglobin 17.6, platelet count 189.  Sodium 137, potassium 4.1, chloride 94, CO2 34, BUN 24, creatinine 1.11, glucose 119.  BNP elevated 182.7.  Covid antigen negative.  Covid-19 PCR: Pending.  Chest x-ray with mild cardiomegaly, right peribronchiolar thickening with patchy airspace disease, right perihilar/infrahilar lung zone consistent with atelectasis versus pneumonia.  Patient received Solu-Medrol 125 mg IV, magnesium 2 g IV and Atrovent neb.  ED physician referred patient for admission for acute hypoxic respiratory failure likely secondary to asthma exacerbation.  Review of Systems: As per HPI otherwise 10 point review of systems negative.    Past Medical History:  Diagnosis Date  . Asthma   . Hypertension     History  reviewed. No pertinent surgical history.   has no history on file for tobacco, alcohol, and drug.  No Known Allergies  History reviewed. No pertinent family history.  Family history reviewed and not pertinent   Prior to Admission medications   Medication Sig Start Date End Date Taking? Authorizing Provider  ADVAIR DISKUS 250-50 MCG/DOSE AEPB Inhale 1 puff into the lungs 2 (two) times daily. 08/12/19  Yes [provider]  albuterol (VENTOLIN HFA) 108 (90 Base) MCG/ACT inhaler Inhale 2 puffs into the lungs every 6 (six) hours as needed for wheezing or shortness of breath. 08/14/19  Yes [provider]  amLODipine (NORVASC) 10 MG tablet Take 10 mg by mouth daily. 08/11/19  Yes [provider]  atenolol (TENORMIN) 25 MG tablet Take 25 mg by mouth daily. 08/09/19  Yes [provider]  guaiFENesin (MUCINEX) 600 MG 12 hr tablet Take 600 mg by mouth 2 (two) times daily as needed for cough or to loosen phlegm.   Yes [provider]  valsartan-hydrochlorothiazide (DIOVAN-HCT) 320-12.5 MG tablet Take 1 tablet by mouth daily. 08/09/19  Yes [provider]    Physical Exam: Vitals:   08/17/19 1232 08/17/19 1237 08/17/19 1300 08/17/19 1330  BP:  139/78 123/78 117/74  Pulse:  82 82 84  Resp:  20 20 18   Temp:      TempSrc:      SpO2: 96% 97% 96% 95%    Constitutional: NAD, calm, comfortable Eyes: PERRL, lids and conjunctivae normal ENMT: Mucous membranes are moist. Posterior pharynx clear of any exudate or lesions.Normal dentition.  Neck: normal, supple,  no masses, no thyromegaly Respiratory: Coarse breath sounds bilaterally with slightly decreased bases, mild crackles and late expiratory wheezing, normal respiratory effort, Cardiovascular: Regular rate and rhythm, no murmurs / rubs / gallops.  1+ pedal edema bilateral lower extremities, right greater than left up to mid shin. 2+ pedal pulses. No carotid bruits.  Abdomen: no tenderness, no masses  palpated. No hepatosplenomegaly. Bowel sounds positive.  Musculoskeletal: no clubbing / cyanosis. No joint deformity upper and lower extremities. Good ROM, no contractures. Normal muscle tone.  Skin: no rashes, lesions, ulcers. No induration Neurologic: CN 2-12 grossly intact. Sensation intact, DTR normal. Strength 5/5 in all 4.  Psychiatric: Normal judgment and insight. Alert and oriented x 3. Normal mood.    Labs on Admission: I have personally reviewed following labs and imaging studies  CBC: Recent Labs  Lab 08/17/19 1219  WBC 11.0*  NEUTROABS 7.4  HGB 17.6*  HCT 57.6*  MCV 110.1*  PLT 474   Basic Metabolic Panel: Recent Labs  Lab 08/17/19 1219  NA 137  K 4.1  CL 94*  CO2 34*  GLUCOSE 119*  BUN 24*  CREATININE 1.11  CALCIUM 9.3   GFR: CrCl cannot be calculated (Unknown ideal weight.). Liver Function Tests: No results for input(s): AST, ALT, ALKPHOS, BILITOT, PROT, ALBUMIN in the last 168 hours. No results for input(s): LIPASE, AMYLASE in the last 168 hours. No results for input(s): AMMONIA in the last 168 hours. Coagulation Profile: No results for input(s): INR, PROTIME in the last 168 hours. Cardiac Enzymes: No results for input(s): CKTOTAL, CKMB, CKMBINDEX, TROPONINI in the last 168 hours. BNP (last 3 results) No results for input(s): PROBNP in the last 8760 hours. HbA1C: No results for input(s): HGBA1C in the last 72 hours. CBG: No results for input(s): GLUCAP in the last 168 hours. Lipid Profile: No results for input(s): CHOL, HDL, LDLCALC, TRIG, CHOLHDL, LDLDIRECT in the last 72 hours. Thyroid Function Tests: No results for input(s): TSH, T4TOTAL, FREET4, T3FREE, THYROIDAB in the last 72 hours. Anemia Panel: No results for input(s): VITAMINB12, FOLATE, FERRITIN, TIBC, IRON, RETICCTPCT in the last 72 hours. Urine analysis: No results found for: COLORURINE, APPEARANCEUR, LABSPEC, PHURINE, GLUCOSEU, HGBUR, BILIRUBINUR, KETONESUR, PROTEINUR, UROBILINOGEN,  NITRITE, LEUKOCYTESUR  Radiological Exams on Admission: DG Chest Port 1 View  Result Date: 08/17/2019 CLINICAL DATA:  Increased shortness of breath. Difficulty breathing for 2 days. Decreased oxygen saturation. EXAM: PORTABLE CHEST 1 VIEW COMPARISON:  None. FINDINGS: Mild cardiomegaly. Bilateral peribronchial thickening with patchy airspace disease in the right perihilar and infrahilar lung zone. No significant pleural effusion. No visualized pneumothorax. No acute osseous abnormalities are seen. IMPRESSION: 1. Mild cardiomegaly. Peribronchial thickening likely related to history of asthma, pulmonary edema felt less likely. 2. Patchy airspace disease in the right perihilar and infrahilar lung may be atelectasis or pneumonia. Electronically Signed   By: Keith Rake M.D.   On: 08/17/2019 13:03    EKG: Independently reviewed.   Assessment/Plan Active Problems:   Acute respiratory failure with hypoxia (HCC)    Acute hypoxic respiratory failure with hypercarbia History of asthma Patient presenting with 2-day history of progressive shortness of breath.  Was found to have SPO2 of 61% on room air, not on home oxygen.  He reports compliance with his home medications to include Advair and albuterol.  He has been utilizing his albuterol inhaler several times daily during this timeframe.  Denies any sick contacts, although he does work in a warehouse with multiple individuals and has yet to receive his  Covid-19 vaccination.  Patient is afebrile without leukocytosis.  PA CO2 on ABG 78.  Patient was given IV Solu-Medrol and 25 mg x 1, magnesium 2 g IV and Atrovent neb in the ED.  He does have notable bilateral lower extremity edema, worse on right.  He does have an elevated BNP of 182.7 with chest x-ray findings of mild cardiomegaly. --Admit inpatient, telemetry --Initial Covid-19 antigen negative, awaiting Covid-19 PCR to return --Continue airborne/contact isolation until returns back negative --Check  procalcitonin, sputum culture --Start antibiotics with azithromycin/ceftriaxone for x-ray findings of right perihilar/infrahilar peribronchial thickening --Continue Solu-Medrol 60 mg IV every 6 hours --Budesonide 0.5mg  BID --Duo nebs scheduled --Incentive spirometry, flutter valve --Continue supplemental oxygen titrated to maintain SPO2 greater than 92% --Check echocardiogram for underlying bilateral lower extremity edema  Bilateral lower extremity edema Dimer 0.47, within normal limits.  Megaly noted on chest x-ray. --Check echocardiogram --Strict I's and O's and daily weights  Obesity Counseled patient on need for aggressive lifestyle changes/weight loss as this complicates all facets of care  Essential hypertension --Continue home atenolol 25 mg p.o. daily, amlodipine 10 mg p.o. daily, Valsartin-HCTZ 320-12.5 mg p.o. daily   DVT prophylaxis: Lovenox Code Status: Full code Family Communication: Updated patient spouse, Shanda Bumps via telephone. Disposition Plan: Patient from home with spouse, anticipate discharge home once titrated off of supplemental oxygen Consults called: None Admission status: Inpatient, telemetry   Severity of Illness: The appropriate patient status for this patient is INPATIENT. Inpatient status is judged to be reasonable and necessary in order to provide the required intensity of service to ensure the patient's safety. The patient's presenting symptoms, physical exam findings, and initial radiographic and laboratory data in the context of their chronic comorbidities is felt to place them at high risk for further clinical deterioration. Furthermore, it is not anticipated that the patient will be medically stable for discharge from the hospital within 2 midnights of admission. The following factors support the patient status of inpatient.   " The patient's presenting symptoms include shortness of breath " The worrisome physical exam findings include increased work  of breathing, hypoxia " The initial radiographic and laboratory data are worrisome because of PaO2 78% on room air " The chronic co-morbidities include obesity, asthma.   * I certify that at the point of admission it is my clinical judgment that the patient will require inpatient hospital care spanning beyond 2 midnights from the point of admission due to high intensity of service, high risk for further deterioration and high frequency of surveillance required.*    Alvira Philips [place] DO [facility] Available via Epic secure chat 7am-7pm After these hours, please refer to coverage provider listed on [email or web address] 08/17/2019, 1:57 PM

## 2019-08-17 NOTE — Progress Notes (Signed)
Pt briefly removed oxygen to ambulate to the bathroom. On Room Air once Pt ambulated back to bed oxygen saturation on room air 75%. Oxygen replaced at 5l [place] and oxygen saturations gradually improved to 92%

## 2019-08-18 LAB — BASIC METABOLIC PANEL
Anion gap: 10 (ref 5–15)
BUN: 32 mg/dL — ABNORMAL HIGH (ref 6–20)
CO2: 34 mmol/L — ABNORMAL HIGH (ref 22–32)
Calcium: 9.2 mg/dL (ref 8.9–10.3)
Chloride: 96 mmol/L — ABNORMAL LOW (ref 98–111)
Creatinine, Ser: 1.16 mg/dL (ref 0.61–1.24)
GFR calc Af Amer: >60 mL/min (ref >60)
GFR calc non Af Amer: >60 mL/min (ref >60)
Glucose, Bld: 165 mg/dL — ABNORMAL HIGH (ref 70–99)
Potassium: 4.5 mmol/L (ref 3.5–5.1)
Sodium: 140 mmol/L (ref 135–145)

## 2019-08-18 LAB — BLOOD GAS, ARTERIAL
Acid-Base Excess: 4.7 mmol/L — ABNORMAL HIGH (ref 0.0–2.0)
Bicarbonate: 34.6 mmol/L — ABNORMAL HIGH (ref 20.0–28.0)
FIO2: 40
O2 Saturation: 99.4 %
Patient temperature: 98.6
pCO2 arterial: 72.1 mmHg (ref 32.0–48.0)
pH, Arterial: 7.302 — ABNORMAL LOW (ref 7.350–7.450)
pO2, Arterial: 179 mmHg — ABNORMAL HIGH (ref 83.0–108.0)

## 2019-08-18 LAB — CBC
HCT: 57.4 % — ABNORMAL HIGH (ref 39.0–52.0)
Hemoglobin: 17.8 g/dL — ABNORMAL HIGH (ref 13.0–17.0)
MCH: 34.4 pg — ABNORMAL HIGH (ref 26.0–34.0)
MCHC: 31 g/dL (ref 30.0–36.0)
MCV: 110.8 fL — ABNORMAL HIGH (ref 80.0–100.0)
Platelets: 182 10*3/uL (ref 150–400)
RBC: 5.18 MIL/uL (ref 4.22–5.81)
RDW: 15 % (ref 11.5–15.5)
WBC: 12.5 10*3/uL — ABNORMAL HIGH (ref 4.0–10.5)
nRBC: 1.4 % — ABNORMAL HIGH (ref 0.0–0.2)

## 2019-08-18 LAB — MAGNESIUM: Magnesium: 2.4 mg/dL (ref 1.7–2.4)

## 2019-08-18 LAB — PROCALCITONIN: Procalcitonin: 0.55 ng/mL

## 2019-08-18 MED ORDER — ATENOLOL 25 MG PO TABS
25.0000 mg | ORAL_TABLET | Freq: Every day | ORAL | Status: DC
  Administered 2019-08-19 – 2019-08-20 (×2): 25 mg via ORAL
  Filled 2019-08-18 (×2): qty 1

## 2019-08-18 MED ORDER — IPRATROPIUM-ALBUTEROL 0.5-2.5 (3) MG/3ML IN SOLN
3.0000 mL | Freq: Three times a day (TID) | RESPIRATORY_TRACT | Status: DC
  Administered 2019-08-19 – 2019-08-20 (×5): 3 mL via RESPIRATORY_TRACT
  Filled 2019-08-18 (×4): qty 3

## 2019-08-18 MED ORDER — PRO-STAT SUGAR FREE PO LIQD
30.0000 mL | Freq: Three times a day (TID) | ORAL | Status: DC
  Administered 2019-08-18 – 2019-08-23 (×14): 30 mL via ORAL
  Filled 2019-08-18 (×14): qty 30

## 2019-08-18 MED ORDER — IRBESARTAN 300 MG PO TABS
300.0000 mg | ORAL_TABLET | Freq: Every day | ORAL | Status: DC
  Administered 2019-08-19 – 2019-08-21 (×3): 300 mg via ORAL
  Filled 2019-08-18 (×3): qty 1

## 2019-08-18 MED ORDER — ALBUTEROL SULFATE (2.5 MG/3ML) 0.083% IN NEBU: 2.5000 mg | INHALATION_SOLUTION | RESPIRATORY_TRACT | Status: DC | PRN

## 2019-08-18 MED ORDER — HYDROCHLOROTHIAZIDE 12.5 MG PO CAPS
12.5000 mg | ORAL_CAPSULE | Freq: Every day | ORAL | Status: DC
  Administered 2019-08-19 – 2019-08-20 (×2): 12.5 mg via ORAL
  Filled 2019-08-18 (×2): qty 1

## 2019-08-18 MED ORDER — METHYLPREDNISOLONE SODIUM SUCC 125 MG IJ SOLR
60.0000 mg | Freq: Two times a day (BID) | INTRAMUSCULAR | Status: DC
  Administered 2019-08-18 – 2019-08-19 (×2): 60 mg via INTRAVENOUS
  Filled 2019-08-18 (×2): qty 2

## 2019-08-18 MED ORDER — AMLODIPINE BESYLATE 10 MG PO TABS
10.0000 mg | ORAL_TABLET | Freq: Every day | ORAL | Status: DC
  Administered 2019-08-19 – 2019-08-20 (×2): 10 mg via ORAL
  Filled 2019-08-18 (×2): qty 1

## 2019-08-18 NOTE — Progress Notes (Signed)
Initial Nutrition Assessment  DOCUMENTATION CODES:   Morbid obesity  INTERVENTION:  - will order 30 mL Prostat TID, each supplement provides 100 kcal and 15 grams of protein.   NUTRITION DIAGNOSIS:   Increased nutrient needs(protein) related to acute illness as evidenced by estimated needs.  GOAL:   Patient will meet greater than or equal to 90% of their needs  MONITOR:   PO intake, Supplement acceptance, Labs, Weight trends  REASON FOR ASSESSMENT:   Consult Assessment of nutrition requirement/status  ASSESSMENT:   39 y.o. male with medical history of asthma, essential HTN, and obesity. He presented from home with 2 day hx of worsening SOB. At home, his O2 sat was 61% on room air. He is not oxygen dependent at baseline. He also reported cough productive of yellow sputum PTA.  He consumed 100% of dinner last night. Patient denies any recent changes in appetite or intakes. He denies noticing any weight changes PTA. Per chart review, weight yesterday was 273 lb and weight today is 285 lb. No PTA weight hx information available.  Per notes: - acute hypoxic respiratory failure with hypercarbia - BLE edema - plan for d/c home once he is off supplemental O2   Labs reviewed; Cl: 96 mmol/l, BUN: 32 mmol/l. Medications reviewed; 2 g IV Mg sulfate x1 run 3/24, 60 mg solu-medrol BID.     NUTRITION - FOCUSED PHYSICAL EXAM:   completed; no muscle or fat wasting.   Diet Order:   Diet Order            Diet Heart Room service appropriate? Yes; Fluid consistency: Thin  Diet effective now              EDUCATION NEEDS:   No education needs have been identified at this time  Skin:  Skin Assessment: Reviewed RN Assessment  Last BM:  3/24  Height:   Ht Readings from Last 1 Encounters:  08/17/19 5\' 7"  (1.702 m)    Weight:   Wt Readings from Last 1 Encounters:  08/18/19 129.4 kg    Estimated Nutritional Needs:  Kcal:  08/20/19 kcal Protein:  110-125  grams Fluid:  >/= 2 L/day     [phone], MS, RD, LDN, CNSC Inpatient Clinical Dietitian RD pager # available in AMION  After hours/weekend pager # available in [facility]

## 2019-08-18 NOTE — Progress Notes (Signed)
PROGRESS NOTE    HOVANES HYMAS  HKV:[id number] DOB: December 20, 1980 DOA: 08/17/2019 PCP: Catha Gosselin, MD      Brief Narrative:  Mr. Dorn is a 39 y.o. M with obesity, likely undiagnosed OSA, asthma moderate persistent on Advair, and HTN who presented with several days progressive shortness of breath, finally hypoxia at home.  In the ER, Covid testing negative, chest x-ray showed a patchy pneumonia, he was hypoxic to 60% which improved in 90% with 4 L nasal cannula, and he had diffuse inspiratory and expiratory wheezing but overall work of breathing was calm.         Assessment & Plan:  Acute exacerbation of asthma causing acute on chronic hypercarbic and hypoxic respiratory failure Patient presented with increased dyspnea, hypoxia, and hypercarbia.  His elevated bicarb suggest some degree of chronic hypercarbia/respiratory failure. -Continue Solu-Medrol -Continue scheduled and as needed bronchodilators    Suspected community-acquired pneumonia, right middle lobe Patchy right middle lobe opacity on chest x-ray, elevated procalcitonin.  Covid negative.  Suspect this is a community-acquired pneumonia superimposed on his asthma. -Continue ceftriaxone and azithromycin, day 2 -Obtain CT chest with contrast  Hypertension Blood pressure controlled -Continue atenolol, amlodipine, HCTZ, irbesartan  Morbid obesity BMI 44, hypertension sleep apnea  Leg swelling Echocardiogram unremarkable.  No evidence of CHF.  Suspect this is all OSA. -Pulmonology follow-up at discharge given evidence of hypercarbia at admission as well as suspected sleep apnea -Outpatient PSG       Disposition: The patient was admitted with a severe asthma exacerbation.   I will discharge when his oxygen sats have been reduced.        MDM: The below labs and imaging reports were reviewed and summarized above.  Medication management as above.  This is a severe exacerbation of his chronic  disease.    DVT prophylaxis: Lovenox Code Status: Full code Family Communication: Wife at the bedside    Consultants:     Procedures:   3/24 chest x-ray-right middle lobe infiltrate  3/24 echocardiogram--EF and valves normal    Antimicrobials:   Ceftriaxone and azithromycin 3/24 >>   Culture data:   3/24 sputum culture pending           Subjective: He is still somewhat dyspneic.  He had some nosebleeds while ago.  No orthopnea.  He has some mild ankle edema as chronically.  No new confusion or vomiting.  Objective: Vitals:   08/18/19 1228 08/18/19 1502 08/18/19 1941 08/18/19 2111  BP: (!) 145/74   118/67  Pulse: 84   71  Resp: 16   18  Temp: 98.4 F (36.9 C)   98.2 F (36.8 C)  TempSrc: Oral   Oral  SpO2: 96% 92% 92% 98%  Weight:      Height:       No intake or output data in the 24 hours ending 08/18/19 2134 Filed Weights   08/17/19 1813 08/18/19 0423  Weight: 123.8 kg 129.4 kg    Examination: General appearance: Obese adult male, alert and in no acute distress.  Eating in the edge of the bed. HEENT: Anicteric, conjunctiva very inflamed, lids and lashes normal. No nasal deformity, discharge, epistaxis.  Lips moist, oropharynx tacky dry, no oral lesions, hearing normal.   Skin: Warm and dry.  Face is plethoric.  Vein stenting on the head.  No jaundice.  No suspicious rashes or lesions. Cardiac: RRR, nl S1-S2, no murmurs appreciated.  Capillary refill is brisk.  JVP not visible.  Trace ankle  LE edema.  Radial pulses 2+ and symmetric. Respiratory: Normal respiratory rate and rhythm.  Respirations shallow, inspiratory wheezing bilaterally, lung sounds diminished on the right, no rales.   Abdomen: Abdomen soft.  No TTP or guarding. No ascites, distension, hepatosplenomegaly.   MSK: No deformities or effusions. Neuro: Awake and alert.  EOMI, moves all extremities. Speech fluent.    Psych: Sensorium intact and responding to questions, attention normal.  Affect normal.  Judgment and insight appear normal.    Data Reviewed: I have personally reviewed following labs and imaging studies:  CBC: Recent Labs  Lab 08/17/19 1219 08/18/19 0319  WBC 11.0* 12.5*  NEUTROABS 7.4  --   HGB 17.6* 17.8*  HCT 57.6* 57.4*  MCV 110.1* 110.8*  PLT 189 182   Basic Metabolic Panel: Recent Labs  Lab 08/17/19 1219 08/18/19 0319  NA 137 140  K 4.1 4.5  CL 94* 96*  CO2 34* 34*  GLUCOSE 119* 165*  BUN 24* 32*  CREATININE 1.11 1.16  CALCIUM 9.3 9.2  MG  --  2.4   GFR: Estimated Creatinine Clearance: 111.6 mL/min (by C-G formula based on SCr of 1.16 mg/dL). Liver Function Tests: No results for input(s): AST, ALT, ALKPHOS, BILITOT, PROT, ALBUMIN in the last 168 hours. No results for input(s): LIPASE, AMYLASE in the last 168 hours. No results for input(s): AMMONIA in the last 168 hours. Coagulation Profile: No results for input(s): INR, PROTIME in the last 168 hours. Cardiac Enzymes: No results for input(s): CKTOTAL, CKMB, CKMBINDEX, TROPONINI in the last 168 hours. BNP (last 3 results) No results for input(s): PROBNP in the last 8760 hours. HbA1C: No results for input(s): HGBA1C in the last 72 hours. CBG: No results for input(s): GLUCAP in the last 168 hours. Lipid Profile: No results for input(s): CHOL, HDL, LDLCALC, TRIG, CHOLHDL, LDLDIRECT in the last 72 hours. Thyroid Function Tests: Recent Labs    08/17/19 1406  TSH 1.725   Anemia Panel: No results for input(s): VITAMINB12, FOLATE, FERRITIN, TIBC, IRON, RETICCTPCT in the last 72 hours. Urine analysis: No results found for: COLORURINE, APPEARANCEUR, LABSPEC, PHURINE, GLUCOSEU, HGBUR, BILIRUBINUR, KETONESUR, PROTEINUR, UROBILINOGEN, NITRITE, LEUKOCYTESUR Sepsis Labs: @LABRCNTIP (procalcitonin:4,lacticacidven:4)  ) Recent Results (from the past 240 hour(s))  SARS CORONAVIRUS 2 (TAT 6-24 HRS) Nasopharyngeal Nasopharyngeal Swab     Status: None   Collection Time: 08/17/19  1:28 PM    Specimen: Nasopharyngeal Swab  Result Value Ref Range Status   SARS Coronavirus 2 NEGATIVE NEGATIVE Final    Comment: (NOTE) SARS-CoV-2 target nucleic acids are NOT DETECTED. The SARS-CoV-2 RNA is generally detectable in upper and lower respiratory specimens during the acute phase of infection. Negative results do not preclude SARS-CoV-2 infection, do not rule out co-infections with other pathogens, and should not be used as the sole basis for treatment or other patient management decisions. Negative results must be combined with clinical observations, patient history, and epidemiological information. The expected result is Negative. Fact Sheet for Patients: 08/19/19 Fact Sheet for Healthcare Providers: HairSlick.no This test is not yet approved or cleared by the quierodirigir.com FDA and  has been authorized for detection and/or diagnosis of SARS-CoV-2 by FDA under an Emergency Use Authorization (EUA). This EUA will remain  in effect (meaning this test can be used) for the duration of the COVID-19 declaration under Section 56 4(b)(1) of the Act, 21 U.S.C. section 360bbb-3(b)(1), unless the authorization is terminated or revoked sooner. Performed at [facility], [address] [place], [place]  Culture, sputum-assessment     Status: None   Collection Time: 08/17/19  2:43 PM   Specimen: Expectorated Sputum  Result Value Ref Range Status   Specimen Description EXPECTORATED SPUTUM  Final   Special Requests NONE  Final   Sputum evaluation   Final    THIS SPECIMEN IS ACCEPTABLE FOR SPUTUM CULTURE Performed at [facility], [address] [place], [place] [address]    Report Status 08/17/2019 FINAL  Final  Culture, respiratory     Status: None (Preliminary result)   Collection Time: 08/17/19  2:43 PM  Result Value Ref Range Status   Specimen Description   Final    EXPECTORATED  SPUTUM Performed at [facility], [address] [place], [place] [address]    Special Requests   Final    NONE Reflexed from [id number] Performed at [facility], [address] [place], [place] [address]    Gram Stain   Final    MODERATE WBC PRESENT, PREDOMINANTLY PMN MODERATE GRAM POSITIVE RODS FEW GRAM POSITIVE COCCI RARE GRAM NEGATIVE RODS    Culture   Final    TOO YOUNG TO READ Performed at [facility], [address] [place], [place] [address]    Report Status PENDING  Incomplete         Radiology Studies: DG Chest Port 1 View  Result Date: 08/17/2019 CLINICAL DATA:  Increased shortness of breath. Difficulty breathing for 2 days. Decreased oxygen saturation. EXAM: PORTABLE CHEST 1 VIEW COMPARISON:  None. FINDINGS: Mild cardiomegaly. Bilateral peribronchial thickening with patchy airspace disease in the right perihilar and infrahilar lung zone. No significant pleural effusion. No visualized pneumothorax. No acute osseous abnormalities are seen. IMPRESSION: 1. Mild cardiomegaly. Peribronchial thickening likely related to history of asthma, pulmonary edema felt less likely. 2. Patchy airspace disease in the right perihilar and infrahilar lung may be atelectasis or pneumonia. Electronically Signed   By: Narda RutherfordMelanie  Sanford M.D.   On: 08/17/2019 13:03   ECHOCARDIOGRAM COMPLETE  Result Date: 08/17/2019    ECHOCARDIOGRAM REPORT   Patient Name[facility]   [facility] Date of Exam: 08/17/2019 Medical Rec #:  [id number]        Height:       67.0 in Accession #:    [id number]       Weight:       280.0 lb Date of Birth:  09/03/1980        BSA:          2.333 m Patient Age:    38 years         BP:           124/69 mmHg Patient Gender: M                HR:           94 bpm. Exam Location:  Inpatient Procedure: 2D Echo Indications:    dyspnea 786.09  History:        Patient has no prior history of Echocardiogram examinations.                 Risk  Factors:Hypertension.  Sonographer:    Celene SkeenVijay Shankar RDCS (AE) Referring Phys: [id number] ERIC J [place] IMPRESSIONS  1. Left ventricular ejection fraction, by estimation, is 60 to 65%. The left ventricle has normal function. The left ventricle has no regional wall motion abnormalities. Left ventricular diastolic parameters were normal.  2. Right ventricular systolic function is normal.  The right ventricular size is normal.  3. The mitral valve is normal in structure. No evidence of mitral valve regurgitation. No evidence of mitral stenosis.  4. The aortic valve is tricuspid. Aortic valve regurgitation is not visualized. No aortic stenosis is present.  5. Shadowing artifact seen.  6. The inferior vena cava is normal in size with greater than 50% respiratory variability, suggesting right atrial pressure of 3 mmHg. FINDINGS  Left Ventricle: Left ventricular ejection fraction, by estimation, is 60 to 65%. The left ventricle has normal function. The left ventricle has no regional wall motion abnormalities. The left ventricular internal cavity size was normal in size. There is  no left ventricular hypertrophy. Left ventricular diastolic parameters were normal. Right Ventricle: The right ventricular size is normal. No increase in right ventricular wall thickness. Right ventricular systolic function is normal. Left Atrium: Left atrial size was normal in size. Right Atrium: Right atrial size was normal in size. Pericardium: There is no evidence of pericardial effusion. Mitral Valve: The mitral valve is normal in structure. There is mild thickening of the mitral valve leaflet(s). There is mild calcification of the mitral valve leaflet(s). Normal mobility of the mitral valve leaflets. Mild mitral annular calcification. No evidence of mitral valve regurgitation. No evidence of mitral valve stenosis. Tricuspid Valve: The tricuspid valve is normal in structure. Tricuspid valve regurgitation is trivial. No evidence of tricuspid  stenosis. Aortic Valve: The aortic valve is tricuspid. Aortic valve regurgitation is not visualized. No aortic stenosis is present. Pulmonic Valve: The pulmonic valve was normal in structure. Pulmonic valve regurgitation is not visualized. No evidence of pulmonic stenosis. Aorta: Shadowing artifact seen. The aortic root is normal in size and structure. Venous: The inferior vena cava is normal in size with greater than 50% respiratory variability, suggesting right atrial pressure of 3 mmHg. IAS/Shunts: The interatrial septum was not well visualized.  LEFT VENTRICLE PLAX 2D LVIDd:         5.60 cm  Diastology LVIDs:         3.30 cm  LV e' lateral:   11.20 cm/s LV PW:         1.00 cm  LV E/e' lateral: 11.7 LV IVS:        1.00 cm  LV e' medial:    7.72 cm/s LVOT diam:     2.50 cm  LV E/e' medial:  17.0 LV SV:         102 LV SV Index:   44 LVOT Area:     4.91 cm  LEFT ATRIUM           Index LA diam:      4.40 cm 1.89 cm/m LA Vol (A2C): 54.7 ml 23.45 ml/m  AORTIC VALVE LVOT Vmax:   107.00 cm/s LVOT Vmean:  75.600 cm/s LVOT VTI:    0.207 m  AORTA Ao Root diam: 3.30 cm MITRAL VALVE MV Area (PHT): 3.42 cm     SHUNTS MV Decel Time: 222 msec     Systemic VTI:  0.21 m MV E velocity: 131.00 cm/s  Systemic Diam: 2.50 cm MV A velocity: 88.10 cm/s MV E/A ratio:  1.49 Jenkins Rouge MD Electronically signed by Jenkins Rouge MD Signature Date/Time: 08/17/2019/4:21:14 PM    Final         Scheduled Meds: . Derrill Memo ON 08/19/2019] amLODipine  10 mg Oral Daily  . [START ON 08/19/2019] atenolol  25 mg Oral Daily  . azithromycin  500 mg Oral Daily  . enoxaparin (  LOVENOX) injection  40 mg Subcutaneous Q24H  . feeding supplement (PRO-STAT SUGAR FREE 64)  30 mL Oral TID BM  . [START ON 08/19/2019] hydrochlorothiazide  12.5 mg Oral Daily  . [START ON 08/19/2019] ipratropium-albuterol  3 mL Nebulization TID  . [START ON 08/19/2019] irbesartan  300 mg Oral Daily  . methylPREDNISolone (SOLU-MEDROL) injection  60 mg Intravenous Q12H    Continuous Infusions: . cefTRIAXone (ROCEPHIN)  IV 2 g (08/18/19 1816)     LOS: 1 day    Time spent: 25 minutes    Alberteen Sam, MD [facility] 08/18/2019, 9:34 PM     Please page though AMION or Epic secure chat:  For [employer], Higher education careers adviser

## 2019-08-18 NOTE — Progress Notes (Signed)
CRITICAL VALUE ALERT  Critical Value: pCO2 72.1  Date & Time Notied:  08/18/19@0511   Provider Notified: Yes  Orders Received/Actions taken:

## 2019-08-18 NOTE — Evaluation (Signed)
Physical Therapy Evaluation Patient Details Name: Joseph [name] MRN: [id number] DOB: 09-18-1980 Today's Date: 08/18/2019   History of Present Illness  Patient is a 39 y.o. male with PMH significant for asthma, essential HTN, obesity. Pt presents from home with 2-day history of worsening shortness of breath.  Patient states checked his oxygen levels at home which were notable for 61% on room air.  He is not oxygen dependent at baseline.  He reports been using his albuterol inhaler quite frequently during this timeframe.  Denies any sick contacts.  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  He has yet to receive his Covid-19 vaccination.  Also endorses productive cough of yellow sputum. Patient admitted for ARF with history of asthma.    Clinical Impression  Joseph [name] is 39 y.o. male admitted with above HPI and diagnosis. Patient is currently limited by functional impairments below (see PT problem list). Patient lives with his wife and 38 y.o. daughter and is independent at baseline. Patient is functioning at independent level for all mobility but currently requires 4L/min during gait to maintain SpO2 above 90%. Patient was educated on pursed lip breathing and taking rest breaks during mobility to prevent SOB. Pt reported 1-2/4 on dyspnea scale during mobility. He does not require skilled acute PT services and is safe to mobilize with RN staff. Please re-consult if there is a change in functional status.     Follow Up Recommendations No PT follow up    Equipment Recommendations  None recommended by PT    Recommendations for Other Services       Precautions / Restrictions Precautions Precautions: None Restrictions Weight Bearing Restrictions: No      Mobility  Bed Mobility Overal bed mobility: Independent     Transfers Overall transfer level: Independent     Ambulation/Gait Ambulation/Gait assistance: Modified independent  (Device/Increase time) Gait Distance (Feet): 500 Feet Assistive device: None Gait Pattern/deviations: WFL(Within Functional Limits)     General Gait Details: pt steady with gait and no overt LOB noted throughout. Patient on 6L/min saturating at 97% initially. Dropped O2 to 4L and pt decreaed to 90-93% during gait, dropped to 3L an dpt decreased to 87%. Educated pt throughout on taking standing rest breaks and on pursed lip breathing.   Stairs     Wheelchair Mobility    Modified Rankin (Stroke Patients Only)       Balance Overall balance assessment: No apparent balance deficits (not formally assessed)(pt denies history of falling)            Pertinent Vitals/Pain Pain Assessment: No/denies pain    Home Living Family/patient expects to be discharged to:: Private residence Living Arrangements: Spouse/significant other Available Help at Discharge: Family;Available 24 hours/day Type of Home: House Home Access: Stairs to enter Entrance Stairs-Rails: None Entrance Stairs-Number of Steps: 2 Home Layout: One level        Prior Function Level of Independence: Independent         Comments: wife is a Quarry manager and he works at [employer] in the [place] Hand: Right    Extremity/Trunk Assessment   Upper Extremity Assessment Upper Extremity Assessment: Overall WFL for tasks assessed    Lower Extremity Assessment Lower Extremity Assessment: Overall WFL for tasks assessed    Cervical / Trunk Assessment Cervical / Trunk Assessment: Normal  Communication   Communication: No difficulties  Cognition Arousal/Alertness: Awake/alert Behavior During Therapy: [facility]  for tasks assessed/performed Overall Cognitive Status: Within Functional Limits for tasks assessed           General Comments      Exercises     Assessment/Plan    PT Assessment Patent does not need any further PT services  PT Problem List         PT Treatment  Interventions      PT Goals (Current goals can be found in the Care Plan section)  Acute Rehab PT Goals Patient Stated Goal: none stated beside going home PT Goal Formulation: All assessment and education complete, DC therapy    Frequency  1x eval    AM-PAC PT "6 Clicks" Mobility  Outcome Measure Help needed turning from your back to your side while in a flat bed without using bedrails?: None Help needed moving from lying on your back to sitting on the side of a flat bed without using bedrails?: None Help needed moving to and from a bed to a chair (including a wheelchair)?: None Help needed standing up from a chair using your arms (e.g., wheelchair or bedside chair)?: None Help needed to walk in hospital room?: None Help needed climbing 3-5 steps with a railing? : None 6 Click Score: 24    End of Session Equipment Utilized During Treatment: Gait belt Activity Tolerance: Patient tolerated treatment well Patient left: in chair;with call bell/phone within reach Nurse Communication: Mobility status PT Visit Diagnosis: Difficulty in walking, not elsewhere classified (R26.2);Other (comment)(SOB)    Time: 0277-4128 PT Time Calculation (min) (ACUTE ONLY): 15 min   Charges:   PT Evaluation $PT Eval Low Complexity: 1 Low          Wynn Maudlin, DPT Physical Therapist with [facility] [phone]  08/18/2019 2:21 PM

## 2019-08-19 ENCOUNTER — Inpatient Hospital Stay (HOSPITAL_COMMUNITY): Payer: Managed Care, Other (non HMO)

## 2019-08-19 DIAGNOSIS — J9622 Acute and chronic respiratory failure with hypercapnia: Secondary | ICD-10-CM

## 2019-08-19 DIAGNOSIS — J9621 Acute and chronic respiratory failure with hypoxia: Secondary | ICD-10-CM

## 2019-08-19 LAB — STREP PNEUMONIAE URINARY ANTIGEN: Strep Pneumo Urinary Antigen: NEGATIVE

## 2019-08-19 LAB — RESPIRATORY PANEL BY PCR

## 2019-08-19 LAB — CBC
HCT: 57.8 % — ABNORMAL HIGH (ref 39.0–52.0)
Hemoglobin: 17.9 g/dL — ABNORMAL HIGH (ref 13.0–17.0)
MCH: 34.3 pg — ABNORMAL HIGH (ref 26.0–34.0)
MCHC: 31 g/dL (ref 30.0–36.0)
MCV: 110.7 fL — ABNORMAL HIGH (ref 80.0–100.0)
Platelets: 206 10*3/uL (ref 150–400)
RBC: 5.22 MIL/uL (ref 4.22–5.81)
RDW: 15.2 % (ref 11.5–15.5)
WBC: 17.6 10*3/uL — ABNORMAL HIGH (ref 4.0–10.5)
nRBC: 0.3 % — ABNORMAL HIGH (ref 0.0–0.2)

## 2019-08-19 LAB — BASIC METABOLIC PANEL
Anion gap: 10 (ref 5–15)
BUN: 50 mg/dL — ABNORMAL HIGH (ref 6–20)
CO2: 34 mmol/L — ABNORMAL HIGH (ref 22–32)
Calcium: 9.2 mg/dL (ref 8.9–10.3)
Chloride: 94 mmol/L — ABNORMAL LOW (ref 98–111)
Creatinine, Ser: 1.15 mg/dL (ref 0.61–1.24)
GFR calc Af Amer: >60 mL/min (ref >60)
GFR calc non Af Amer: >60 mL/min (ref >60)
Glucose, Bld: 164 mg/dL — ABNORMAL HIGH (ref 70–99)
Potassium: 4.8 mmol/L (ref 3.5–5.1)
Sodium: 138 mmol/L (ref 135–145)

## 2019-08-19 LAB — CULTURE, RESPIRATORY W GRAM STAIN: Culture: NORMAL

## 2019-08-19 MED ORDER — IOHEXOL 300 MG/ML  SOLN
75.0000 mL | Freq: Once | INTRAMUSCULAR | Status: AC | PRN
  Administered 2019-08-19: 75 mL via INTRAVENOUS

## 2019-08-19 MED ORDER — ALPRAZOLAM 0.5 MG PO TABS
0.5000 mg | ORAL_TABLET | Freq: Once | ORAL | Status: AC
  Administered 2019-08-19: 0.5 mg via ORAL
  Filled 2019-08-19: qty 1

## 2019-08-19 MED ORDER — CHLORHEXIDINE GLUCONATE CLOTH 2 % EX PADS
6.0000 | MEDICATED_PAD | Freq: Every day | CUTANEOUS | Status: DC
  Administered 2019-08-20: 08:00:00 6 via TOPICAL

## 2019-08-19 MED ORDER — MELATONIN 3 MG PO TABS
3.0000 mg | ORAL_TABLET | Freq: Every evening | ORAL | Status: DC | PRN
  Administered 2019-08-19 – 2019-08-20 (×2): 3 mg via ORAL
  Filled 2019-08-19 (×2): qty 1

## 2019-08-19 MED ORDER — SODIUM CHLORIDE (PF) 0.9 % IJ SOLN
INTRAMUSCULAR | Status: AC
  Filled 2019-08-19: qty 50

## 2019-08-19 MED ORDER — FUROSEMIDE 10 MG/ML IJ SOLN
40.0000 mg | Freq: Once | INTRAMUSCULAR | Status: AC
  Administered 2019-08-19: 40 mg via INTRAVENOUS
  Filled 2019-08-19: qty 4

## 2019-08-19 MED ORDER — METHYLPREDNISOLONE SODIUM SUCC 40 MG IJ SOLR
20.0000 mg | Freq: Two times a day (BID) | INTRAMUSCULAR | Status: DC
  Administered 2019-08-19 – 2019-08-23 (×8): 20 mg via INTRAVENOUS
  Filled 2019-08-19 (×8): qty 1

## 2019-08-19 MED ORDER — ORAL CARE MOUTH RINSE
15.0000 mL | Freq: Two times a day (BID) | OROMUCOSAL | Status: DC
  Administered 2019-08-19 – 2019-08-22 (×6): 15 mL via OROMUCOSAL

## 2019-08-19 NOTE — Progress Notes (Signed)
Pt. placed on BiPAP V-60 per order, tolerating well, RT to monitor. 

## 2019-08-19 NOTE — Progress Notes (Signed)
PROGRESS NOTE    Joseph [name]  QIO:[id number] DOB: 1980/07/07 DOA: 08/17/2019 PCP: Catha Gosselin, MD      Brief Narrative:  Mr. Hard is a 39 y.o. M with obesity, likely undiagnosed OSA, asthma moderate persistent on Advair, and HTN who presented with several days progressive shortness of breath, finally hypoxia at home.  In the ER, Covid testing negative, chest x-ray showed a patchy pneumonia, he was hypoxic to 60% which improved in 90% with 4 L nasal cannula, and he had diffuse inspiratory and expiratory wheezing but overall work of breathing was calm.         Assessment & Plan:  Acute exacerbation of asthma causing acute on chronic hypercarbic and hypoxic respiratory failure Patient presented with increased dyspnea, hypoxia, and hypercarbia.  His elevated bicarb suggest some degree of chronic hypercarbia/respiratory failure.  Weaned to 4 L overnight, slight improvement.  CT chest with small effusions, no mass or pneumonia. -Continue Solu-medrol -Continue scheduled and as needed bronchodilators -Consult Pulm re: BiPAP?, augmentation of therapy?   Suspected community-acquired pneumonia, right middle lobe Patchy right middle lobe opacity on chest x-ray, elevated procalcitonin.  Covid negative.  Suspect this is a community-acquired pneumonia superimposed on his asthma.  CT shows no residual pneumonia, although I would suspect this just cleared quickly and would favor continuing antibiotics for 2 more days. -Continue ceftriaxone and azithromycin, day 3    Hypertension Blood pressure good -Continue atenolol, amlodipine, HCTZ, irbesartan  Morbid obesity BMI 44, hypertension sleep apnea  Leg swelling Echocardiogram with normal EF and diastolic function.  No orthopnea, pitting edema, PND to suggest CHF.  I would have thought his swelling was better explained by OSA however small effusions on CT chest. -IV Lasix -Strict intake and output -Follow-up response to  Lasix  Likely sleep apnea -Pulmonology follow-up at discharge given evidence of hypercarbia at admission as well as suspected sleep apnea -Outpatient PSG       Disposition: The patient was admitted with a severe asthma exacerbation, possibly some superimposed fluid overload.  We will continue IV antibiotics, IV steroids, frequent bronchodilators, and IV Lasix as he continues on a new oxygen requirement of 4 L.  Assess response to Lasix and possibly home on Saturday with O2, more likely Sunday.        MDM: The below labs and imaging reports reviewed and summarized above.  Medication management as above.     DVT prophylaxis: Lovenox Code Status: Full code Family Communication: Wife at the bedside    Consultants:   Pulmonology  Procedures:   3/24 chest x-ray-right middle lobe infiltrate  3/24 echocardiogram--EF and valves normal  3/26 CT chest -- small effusions    Antimicrobials:   Ceftriaxone and azithromycin 3/24 >>   Culture data:   3/24 sputum culture pending           Subjective: Patient still has some dyspnea on exertion, but no orthopnea, paroxysmal nocturnal dyspnea.  No fever, sputum production.  Still wheezing.     Objective: Vitals:   08/19/19 0827 08/19/19 1238 08/19/19 1244 08/19/19 1331  BP: 122/64  129/63   Pulse: 67 75 68   Resp: 20  16   Temp:   98.2 F (36.8 C)   TempSrc:   Oral   SpO2: 98% (!) 87% 96% 94%  Weight:      Height:       No intake or output data in the 24 hours ending 08/19/19 1706 Filed Weights   08/17/19 1813 08/18/19  0423 08/19/19 0430  Weight: 123.8 kg 129.4 kg 128.1 kg    Examination: General appearance: Obese adult male, alert and in no acute distress.  Lying flat in bed. HEENT: Anicteric, conjunctiva inflamed, lids and lashes normal. No nasal deformity, discharge, epistaxis.  Lips moist, teeth normal. OP moist, no oral lesions.   Skin: Warm and dry.  No suspicious rashes or lesions. Cardiac:  RRR, no murmurs appreciated.  JVP not visible, trace LE edema with chronic venous stasis change.    Respiratory: Normal respiratory rate and rhythm, lungs sound shallow, no wheezing actually today.  No rales. Abdomen: Abdomen soft.  No tenderness palpation or guarding. No ascites, distension, hepatosplenomegaly.   MSK: No deformities or effusions of the large joints of the upper or lower extremities bilaterally. Neuro: Awake and alert. Naming is grossly intact, and the patient's recall, recent and remote, as well as general fund of knowledge seem within normal limits.  Muscle tone normal, without fasciculations.  Moves all extremities equally and with normal coordination.  Speech fluent.    Psych: Sensorium intact and responding to questions, attention normal. Affect normal.  Judgment and insight appear normal.      Data Reviewed: I have personally reviewed following labs and imaging studies:  CBC: Recent Labs  Lab 08/17/19 1219 08/18/19 0319 08/19/19 0348  WBC 11.0* 12.5* 17.6*  NEUTROABS 7.4  --   --   HGB 17.6* 17.8* 17.9*  HCT 57.6* 57.4* 57.8*  MCV 110.1* 110.8* 110.7*  PLT 189 182 206   Basic Metabolic Panel: Recent Labs  Lab 08/17/19 1219 08/18/19 0319 08/19/19 0348  NA 137 140 138  K 4.1 4.5 4.8  CL 94* 96* 94*  CO2 34* 34* 34*  GLUCOSE 119* 165* 164*  BUN 24* 32* 50*  CREATININE 1.11 1.16 1.15  CALCIUM 9.3 9.2 9.2  MG  --  2.4  --    GFR: Estimated Creatinine Clearance: 112 mL/min (by C-G formula based on SCr of 1.15 mg/dL). Liver Function Tests: No results for input(s): AST, ALT, ALKPHOS, BILITOT, PROT, ALBUMIN in the last 168 hours. No results for input(s): LIPASE, AMYLASE in the last 168 hours. No results for input(s): AMMONIA in the last 168 hours. Coagulation Profile: No results for input(s): INR, PROTIME in the last 168 hours. Cardiac Enzymes: No results for input(s): CKTOTAL, CKMB, CKMBINDEX, TROPONINI in the last 168 hours. BNP (last 3 results) No  results for input(s): PROBNP in the last 8760 hours. HbA1C: No results for input(s): HGBA1C in the last 72 hours. CBG: No results for input(s): GLUCAP in the last 168 hours. Lipid Profile: No results for input(s): CHOL, HDL, LDLCALC, TRIG, CHOLHDL, LDLDIRECT in the last 72 hours. Thyroid Function Tests: Recent Labs    08/17/19 1406  TSH 1.725   Anemia Panel: No results for input(s): VITAMINB12, FOLATE, FERRITIN, TIBC, IRON, RETICCTPCT in the last 72 hours. Urine analysis: No results found for: COLORURINE, APPEARANCEUR, LABSPEC, PHURINE, GLUCOSEU, HGBUR, BILIRUBINUR, KETONESUR, PROTEINUR, UROBILINOGEN, NITRITE, LEUKOCYTESUR Sepsis Labs: @LABRCNTIP (procalcitonin:4,lacticacidven:4)  ) Recent Results (from the past 240 hour(s))  SARS CORONAVIRUS 2 (TAT 6-24 HRS) Nasopharyngeal Nasopharyngeal Swab     Status: None   Collection Time: 08/17/19  1:28 PM   Specimen: Nasopharyngeal Swab  Result Value Ref Range Status   SARS Coronavirus 2 NEGATIVE NEGATIVE Final    Comment: (NOTE) SARS-CoV-2 target nucleic acids are NOT DETECTED. The SARS-CoV-2 RNA is generally detectable in upper and lower respiratory specimens during the acute phase of infection. Negative results do  not preclude SARS-CoV-2 infection, do not rule out co-infections with other pathogens, and should not be used as the sole basis for treatment or other patient management decisions. Negative results must be combined with clinical observations, patient history, and epidemiological information. The expected result is Negative. Fact Sheet for Patients: SugarRoll.be Fact Sheet for Healthcare Providers: https://www.woods-mathews.com/ This test is not yet approved or cleared by the [place] FDA and  has been authorized for detection and/or diagnosis of SARS-CoV-2 by FDA under an Emergency Use Authorization (EUA). This EUA will remain  in effect (meaning this test can be used) for  the duration of the COVID-19 declaration under Section 56 4(b)(1) of the Act, 21 U.S.C. section 360bbb-3(b)(1), unless the authorization is terminated or revoked sooner. Performed at [facility], [place] [address] [place], [place] [address]   Culture, sputum-assessment     Status: None   Collection Time: 08/17/19  2:43 PM   Specimen: Expectorated Sputum  Result Value Ref Range Status   Specimen Description EXPECTORATED SPUTUM  Final   Special Requests NONE  Final   Sputum evaluation   Final    THIS SPECIMEN IS ACCEPTABLE FOR SPUTUM CULTURE Performed at [facility], [place] [address] [place], [place] [address]    Report Status 08/17/2019 FINAL  Final  Culture, respiratory     Status: None   Collection Time: 08/17/19  2:43 PM  Result Value Ref Range Status   Specimen Description   Final    EXPECTORATED SPUTUM Performed at [place] [address], [place], [place] [address]    Special Requests   Final    NONE Reflexed from [id number] Performed at [facility], [place] [address] [place], [place] [address]    Gram Stain   Final    MODERATE WBC PRESENT, PREDOMINANTLY PMN MODERATE GRAM POSITIVE RODS FEW GRAM POSITIVE COCCI RARE GRAM NEGATIVE RODS    Culture   Final    Consistent with normal respiratory flora. Performed at [facility], [place] [address] [place], [place] [address]    Report Status 08/19/2019 FINAL  Final         Radiology Studies: CT CHEST W CONTRAST  Result Date: 08/19/2019 CLINICAL DATA:  Assess for pneumonia or pleural effusion. Assess for abscess. Hypoxia and respiratory failure. EXAM: CT CHEST WITH CONTRAST TECHNIQUE: Multidetector CT imaging of the chest was performed during intravenous contrast administration. CONTRAST:  63mL OMNIPAQUE IOHEXOL 300 MG/ML  SOLN COMPARISON:  None. FINDINGS: Cardiovascular: No significant vascular findings. The heart size is mildly enlarged. No pericardial  effusion. Mediastinum/Nodes: There are mild prominent bilateral hilar lymph nodes which may be reactive lymph nodes. No mediastinal lymphadenopathy is identified. No axillary lymphadenopathy is identified. The thyroid glands, trachea and esophagus are normal. Lungs/Pleura: There are minimal bilateral pleural effusions. Mild dependent atelectasis of posterior right lung base is identified. There is no focal pneumonia in the right infrahilar and perihilar region. Tiny focus of density is identified in the posterior right apex favor focal scar or atelectasis. Upper Abdomen: Diffuse low density of the liver is identified without focal liver lesion. The other visualized upper abdominal structures are normal. Musculoskeletal: Minimal degenerative joint changes of thoracic spine are identified. IMPRESSION: 1. No focal pneumonia identified in the right infrahilar and perihilar region. 2. Minimal bilateral pleural effusions. 3. Fatty infiltration of liver. Electronically Signed   By: Abelardo Diesel M.D.   On: 08/19/2019 13:02  Scheduled Meds: . amLODipine  10 mg Oral Daily  . atenolol  25 mg Oral Daily  . azithromycin  500 mg Oral Daily  . enoxaparin (LOVENOX) injection  40 mg Subcutaneous Q24H  . feeding supplement (PRO-STAT SUGAR FREE 64)  30 mL Oral TID BM  . hydrochlorothiazide  12.5 mg Oral Daily  . ipratropium-albuterol  3 mL Nebulization TID  . irbesartan  300 mg Oral Daily  . methylPREDNISolone (SOLU-MEDROL) injection  20 mg Intravenous Q12H  . sodium chloride (PF)       Continuous Infusions: . cefTRIAXone (ROCEPHIN)  IV 2 g (08/19/19 1617)     LOS: 2 days    Time spent: 35 minutes    Alberteen Sam, MD [facility] 08/19/2019, 5:06 PM     Please page though AMION or Epic secure chat:  For [employer], Higher education careers adviser

## 2019-08-19 NOTE — Progress Notes (Signed)
Patient transferred to ICU at this time in no acute episode.

## 2019-08-19 NOTE — Consult Note (Addendum)
Name: Joseph [name] MRN: [id number] DOB: [birth year]    ADMISSION DATE:  08/17/2019 CONSULTATION DATE: 08/19/2019  REFERRING MD : Joseph [name]  CHIEF COMPLAINT: Hypoxia, hypercarbia respiratory failure most likely undiagnosed obstructive sleep apnea  BRIEF PATIENT DESCRIPTION: 39 year old obese male in no acute distress  SIGNIFICANT EVENTS    STUDIES:  2D echo essentially normal   HI58 year old male former smoker who quit 10 years ago and he reports increasing weight gain over the last 5 years to current weight of 288 pounds.  He has lifelong history of asthma for which he takes Advair and albuterol as needed.  This is prescribed by his primary care physician and has not seen a pulmonologist in the past.  He works in a warehouse loading and unloading.  He is able to complete his daily activities without problems until approximately 2 weeks ago at which time he noticed increasing shortness of breath and fatigue.  Endorses a cough with clear sputum wheezing which was more than normal and dyspnea to the point that he sought medical treatment. Arterial blood gases on 40% FiO2 showed pH of 7.30 carbon dioxide of 72 and a PO2 of 179 with a bicarbonate of 35 chemistries show a total CO2 of 34.  His chest x-ray revealed borderline cardiomegaly and right greater than left airspace disease. Currently he is requiring 4 L nasal cannula to maintain sats greater than 88% pulmonary critical care was called to consult at this time.  He is awake and alert in no acute distress at time of examination.STORY OF PRESENT ILLNESS:     PAST MEDICAL HISTORY :   has a past medical history of Asthma and Hypertension.  has no past surgical history on file. Prior to Admission medications   Medication Sig Start Date End Date Taking? Authorizing Provider  ADVAIR DISKUS 250-50 MCG/DOSE AEPB Inhale 1 puff into the lungs 2 (two) times daily. 08/12/19  Yes [provider]  albuterol (VENTOLIN HFA) 108 (90 Base)  MCG/ACT inhaler Inhale 2 puffs into the lungs every 6 (six) hours as needed for wheezing or shortness of breath. 08/14/19  Yes [provider]  amLODipine (NORVASC) 10 MG tablet Take 10 mg by mouth daily. 08/11/19  Yes [provider]  atenolol (TENORMIN) 25 MG tablet Take 25 mg by mouth daily. 08/09/19  Yes [provider]  guaiFENesin (MUCINEX) 600 MG 12 hr tablet Take 600 mg by mouth 2 (two) times daily as needed for cough or to loosen phlegm.   Yes [provider]  valsartan-hydrochlorothiazide (DIOVAN-HCT) 320-12.5 MG tablet Take 1 tablet by mouth daily. 08/09/19  Yes [provider]   No Known Allergies  FAMILY HISTORY:  family history is not on file. SOCIAL HISTORY:  reports that he has never smoked. He has never used smokeless tobacco.  REVIEW OF SYSTEMS:   Reports childhood asthma. For further details  SUBJECTIVE:  39 year old male who is in no acute distress at rest VITAL SIGNS: Temp:  [96.5 F (35.8 C)-98.4 F (36.9 C)] 97.8 F (36.6 C) (03/26 0629) Pulse Rate:  [63-84] 67 (03/26 0827) Resp:  [16-20] 20 (03/26 0827) BP: (114-145)/(60-74) 122/64 (03/26 0827) SpO2:  [92 %-98 %] 98 % (03/26 0827) Weight:  [128.1 kg] 128.1 kg (03/26 0430)  PHYSICAL EXAMINATION: General: Obese male sitting in a chair no acute distress Neuro: Grossly intact no focal defects appreciated HEENT: Short neck noted.  Oropharynx unremarkable Cardiovascular: Heart sounds are regular regular rate and rhythm Lungs: Diminished throughout Abdomen: Obese soft not  tender Musculoskeletal:  intact Skin: Warm and dry  Recent Labs  Lab 08/17/19 1219 08/18/19 0319 08/19/19 0348  NA 137 140 138  K 4.1 4.5 4.8  CL 94* 96* 94*  CO2 34* 34* 34*  BUN 24* 32* 50*  CREATININE 1.11 1.16 1.15  GLUCOSE 119* 165* 164*   Recent Labs  Lab 08/17/19 1219 08/18/19 0319 08/19/19 0348  HGB 17.6* 17.8* 17.9*  HCT 57.6* 57.4* 57.8*  WBC 11.0* 12.5* 17.6*  PLT 189  182 206   DG Chest Port 1 View  Result Date: 08/17/2019 CLINICAL DATA:  Increased shortness of breath. Difficulty breathing for 2 days. Decreased oxygen saturation. EXAM: PORTABLE CHEST 1 VIEW COMPARISON:  None. FINDINGS: Mild cardiomegaly. Bilateral peribronchial thickening with patchy airspace disease in the right perihilar and infrahilar lung zone. No significant pleural effusion. No visualized pneumothorax. No acute osseous abnormalities are seen. IMPRESSION: 1. Mild cardiomegaly. Peribronchial thickening likely related to history of asthma, pulmonary edema felt less likely. 2. Patchy airspace disease in the right perihilar and infrahilar lung may be atelectasis or pneumonia. Electronically Signed   By: Keith Rake M.D.   On: 08/17/2019 13:03   ECHOCARDIOGRAM COMPLETE  Result Date: 08/17/2019    ECHOCARDIOGRAM REPORT   Patient Name:   Joseph [name] [facility] Date of Exam: 08/17/2019 Medical Rec #:  [id number]        Height:       67.0 in Accession #:    [id number]       Weight:       280.0 lb Date of Birth:  1980-11-27        BSA:          2.333 m Patient Age:    49 years         BP:           124/69 mmHg Patient Gender: M                HR:           94 bpm. Exam Location:  Inpatient Procedure: 2D Echo Indications:    dyspnea 786.09  History:        Patient has no prior history of Echocardiogram examinations.                 Risk Factors:Hypertension.  Sonographer:    Jannett Celestine RDCS (AE) Referring Phys: [id number] Joseph J [place] IMPRESSIONS  1. Left ventricular ejection fraction, by estimation, is 60 to 65%. The left ventricle has normal function. The left ventricle has no regional wall motion abnormalities. Left ventricular diastolic parameters were normal.  2. Right ventricular systolic function is normal. The right ventricular size is normal.  3. The mitral valve is normal in structure. No evidence of mitral valve regurgitation. No evidence of mitral stenosis.  4. The aortic valve is tricuspid. Aortic  valve regurgitation is not visualized. No aortic stenosis is present.  5. Shadowing artifact seen.  6. The inferior vena cava is normal in size with greater than 50% respiratory variability, suggesting right atrial pressure of 3 mmHg. FINDINGS  Left Ventricle: Left ventricular ejection fraction, by estimation, is 60 to 65%. The left ventricle has normal function. The left ventricle has no regional wall motion abnormalities. The left ventricular internal cavity size was normal in size. There is  no left ventricular hypertrophy. Left ventricular diastolic parameters were normal. Right Ventricle: The right ventricular size is normal. No increase in right ventricular wall thickness. Right ventricular systolic function is  normal. Left Atrium: Left atrial size was normal in size. Right Atrium: Right atrial size was normal in size. Pericardium: There is no evidence of pericardial effusion. Mitral Valve: The mitral valve is normal in structure. There is mild thickening of the mitral valve leaflet(s). There is mild calcification of the mitral valve leaflet(s). Normal mobility of the mitral valve leaflets. Mild mitral annular calcification. No evidence of mitral valve regurgitation. No evidence of mitral valve stenosis. Tricuspid Valve: The tricuspid valve is normal in structure. Tricuspid valve regurgitation is trivial. No evidence of tricuspid stenosis. Aortic Valve: The aortic valve is tricuspid. Aortic valve regurgitation is not visualized. No aortic stenosis is present. Pulmonic Valve: The pulmonic valve was normal in structure. Pulmonic valve regurgitation is not visualized. No evidence of pulmonic stenosis. Aorta: Shadowing artifact seen. The aortic root is normal in size and structure. Venous: The inferior vena cava is normal in size with greater than 50% respiratory variability, suggesting right atrial pressure of 3 mmHg. IAS/Shunts: The interatrial septum was not well visualized.  LEFT VENTRICLE PLAX 2D LVIDd:          5.60 cm  Diastology LVIDs:         3.30 cm  LV e' lateral:   11.20 cm/s LV PW:         1.00 cm  LV E/e' lateral: 11.7 LV IVS:        1.00 cm  LV e' medial:    7.72 cm/s LVOT diam:     2.50 cm  LV E/e' medial:  17.0 LV SV:         102 LV SV Index:   44 LVOT Area:     4.91 cm  LEFT ATRIUM           Index LA diam:      4.40 cm 1.89 cm/m LA Vol (A2C): 54.7 ml 23.45 ml/m  AORTIC VALVE LVOT Vmax:   107.00 cm/s LVOT Vmean:  75.600 cm/s LVOT VTI:    0.207 m  AORTA Ao Root diam: 3.30 cm MITRAL VALVE MV Area (PHT): 3.42 cm     SHUNTS MV Decel Time: 222 msec     Systemic VTI:  0.21 m MV E velocity: 131.00 cm/s  Systemic Diam: 2.50 cm MV A velocity: 88.10 cm/s MV E/A ratio:  1.49 Charlton Haws MD Electronically signed by Charlton Haws MD Signature Date/Time: 08/17/2019/4:21:14 PM    Final     ASSESSMENT:    Acute respiratory failure with hypoxia    Obesity 288 lbs   Essential hypertension Undiagnosed obstructive sleep Suspected obesity hypoventilation syndrome Hypoxic respiratory failure Hypercarbia  Discussion: 39 year old male former smoker who quit 10 years ago and he reports increasing weight gain over the last 5 years to current weight of 288 pounds.  He has lifelong history of asthma for which he takes Advair and albuterol as needed.  This is prescribed by his primary care physician and has not seen a medicine physician in the past.  He works in a warehouse loading and unloading.  He is able to complete his daily activities without problems until approximately 2 weeks ago at which time he noticed increasing shortness of breath and fatigue.  Endorses a cough with clear sputum wheezing which was more than normal and dyspnea to the point that he sought medical treatment. Arterial blood gases on 40% FiO2 showed pH of 7.30 carbon dioxide of 72 and a PO2 of 179 with a bicarbonate of 35 chemistries show a total CO2 of  34.  His chest x-ray revealed borderline cardiomegaly and right greater than left airspace  disease. Currently he is requiring 4 L nasal cannula to maintain sats greater than 88% pulmonary critical care was called to consult at this time.  He is awake and alert in no acute distress at time of examination.       PLAN: Complete current course of antimicrobial therapy Quickly wean steroids to off Continue bronchodilator therapy 1 dose of Lasix Wean FiO2 as tolerated IS Flutter valve Walk in hall and document sats We will need at least CPAP if not BiPAP as an out patient. 2D echo was noted to normal Follow-up with pulmonologist sleep medicine physician.  Will need sleep study. Weight loss   Brett Canales  ACNP Acute Care Nurse Practitioner [facility]/Critical Care Please consult Amion 08/19/2019, 9:09 AM

## 2019-08-20 LAB — COMPREHENSIVE METABOLIC PANEL
ALT: 42 U/L (ref 0–44)
AST: 38 U/L (ref 15–41)
Albumin: 3.1 g/dL — ABNORMAL LOW (ref 3.5–5.0)
Alkaline Phosphatase: 50 U/L (ref 38–126)
Anion gap: 10 (ref 5–15)
BUN: 51 mg/dL — ABNORMAL HIGH (ref 6–20)
CO2: 33 mmol/L — ABNORMAL HIGH (ref 22–32)
Calcium: 9.1 mg/dL (ref 8.9–10.3)
Chloride: 93 mmol/L — ABNORMAL LOW (ref 98–111)
Creatinine, Ser: 1.22 mg/dL (ref 0.61–1.24)
GFR calc Af Amer: >60 mL/min (ref >60)
GFR calc non Af Amer: >60 mL/min (ref >60)
Glucose, Bld: 151 mg/dL — ABNORMAL HIGH (ref 70–99)
Potassium: 5 mmol/L (ref 3.5–5.1)
Sodium: 136 mmol/L (ref 135–145)
Total Bilirubin: 1.3 mg/dL — ABNORMAL HIGH (ref 0.3–1.2)
Total Protein: 6.3 g/dL — ABNORMAL LOW (ref 6.5–8.1)

## 2019-08-20 LAB — CBC
HCT: 54.6 % — ABNORMAL HIGH (ref 39.0–52.0)
Hemoglobin: 17.3 g/dL — ABNORMAL HIGH (ref 13.0–17.0)
MCH: 34.1 pg — ABNORMAL HIGH (ref 26.0–34.0)
MCHC: 31.7 g/dL (ref 30.0–36.0)
MCV: 107.7 fL — ABNORMAL HIGH (ref 80.0–100.0)
Platelets: 192 10*3/uL (ref 150–400)
RBC: 5.07 MIL/uL (ref 4.22–5.81)
RDW: 14.9 % (ref 11.5–15.5)
WBC: 13.6 10*3/uL — ABNORMAL HIGH (ref 4.0–10.5)
nRBC: 0.3 % — ABNORMAL HIGH (ref 0.0–0.2)

## 2019-08-20 LAB — MAGNESIUM: Magnesium: 2.5 mg/dL — ABNORMAL HIGH (ref 1.7–2.4)

## 2019-08-20 LAB — PHOSPHORUS: Phosphorus: 4.8 mg/dL — ABNORMAL HIGH (ref 2.5–4.6)

## 2019-08-20 LAB — MRSA PCR SCREENING: MRSA by PCR: NEGATIVE

## 2019-08-20 MED ORDER — BUDESONIDE 0.25 MG/2ML IN SUSP
0.2500 mg | Freq: Two times a day (BID) | RESPIRATORY_TRACT | Status: DC
  Administered 2019-08-20 – 2019-08-23 (×6): 0.25 mg via RESPIRATORY_TRACT
  Filled 2019-08-20 (×6): qty 2

## 2019-08-20 MED ORDER — FUROSEMIDE 40 MG PO TABS
40.0000 mg | ORAL_TABLET | Freq: Every day | ORAL | Status: DC
  Administered 2019-08-20 – 2019-08-21 (×2): 40 mg via ORAL
  Filled 2019-08-20 (×2): qty 1

## 2019-08-20 MED ORDER — IPRATROPIUM-ALBUTEROL 0.5-2.5 (3) MG/3ML IN SOLN
3.0000 mL | RESPIRATORY_TRACT | Status: DC
  Administered 2019-08-20 – 2019-08-21 (×5): 3 mL via RESPIRATORY_TRACT
  Filled 2019-08-20 (×5): qty 3

## 2019-08-20 NOTE — Consult Note (Addendum)
Name: Joseph [name] MRN: [id number] DOB: September 12, 1980    ADMISSION DATE:  08/17/2019 CONSULTATION DATE: 08/19/2019  REFERRING MD : [facility]  CHIEF COMPLAINT: Hypoxia, hypercarbia respiratory failure most likely undiagnosed obstructive sleep apnea      BRIEF 39 year old male former smoker who quit 10 years ago and he reports increasing weight gain over the last 5 years to current weight of 288 pounds.  He has lifelong history of asthma for which he takes Advair and albuterol as needed.  This is prescribed by his primary care physician and has not seen a pulmonologist in the past.  He works in a warehouse loading and unloading.  He is able to complete his daily activities without problems until approximately 2 weeks ago at which time he noticed increasing shortness of breath and fatigue.  Endorses a cough with clear sputum wheezing which was more than normal and dyspnea to the point that he sought medical treatment. Arterial blood gases on 40% FiO2 showed pH of 7.30 carbon dioxide of 72 and a PO2 of 179 with a bicarbonate of 35 chemistries show a total CO2 of 34.  His chest x-ray revealed borderline cardiomegaly and right greater than left airspace disease. Currently he is requiring 4 L nasal cannula to maintain sats greater than 88% pulmonary critical care was called to consult at this time.  He is awake and alert in no acute distress at time of examination.    has a past medical history of Asthma and Hypertension.   has no past surgical history on file.    EVENTS     08/17/2019 -admit 3/.26 - ccm consult and trnsfer to ICU for bipap QHS    SUBJECTIVE/OVERNIGHT/INTERVAL HX    08/20/2019 -motor ICU last night for initiation of BiPAP.  This evening he says that with BiPAP last night he is feeling better.  His oxygen is being weaned down and then his wheezing got worse.  He tells me that he has a long history of asthma.  He quit smoking many years ago.  He says that he spent all his  life wheezing.  He has wheezing currently and he says is out of baseline.  He bicarb and BUN have gone up -  being diuresed.   VITAL SIGNS: Temp:  [97.5 F (36.4 C)-98.8 F (37.1 C)] 98.4 F (36.9 C) (03/27 1727) Pulse Rate:  [45-84] 84 (03/27 1600) Resp:  [12-28] 22 (03/27 0748) BP: (115-142)/(53-70) 141/59 (03/27 1600) SpO2:  [87 %-100 %] 87 % (03/27 1600) Weight:  [128.8 kg] 128.8 kg (03/27 0500)  PHYSICAL EXAMINATION: Obese man sitting in the chair and talking to his wife and watching TV.  On 3 L nasal cannula.  He continues to have redness from his polycythemia.  He has faint wheezing diffusely.  Chronic venous stasis edema present.  Alert and oriented x3.  Abdomen obese and soft.     LABS    PULMONARY Recent Labs  Lab 08/17/19 1230 08/18/19 0319  PHART 7.317* 7.302*  PCO2ART 78.0* 72.1*  PO2ART 79.5* 179*  HCO3 38.7* 34.6*  O2SAT 94.7 99.4    CBC Recent Labs  Lab 08/18/19 0319 08/19/19 0348 08/20/19 0254  HGB 17.8* 17.9* 17.3*  HCT 57.4* 57.8* 54.6*  WBC 12.5* 17.6* 13.6*  PLT 182 206 192    COAGULATION No results for input(s): INR in the last 168 hours.  CARDIAC  No results for input(s): TROPONINI in the last 168 hours. No results for input(s): PROBNP in the last 168  hours.   CHEMISTRY Recent Labs  Lab 08/17/19 1219 08/17/19 1219 08/18/19 0319 08/18/19 0319 08/19/19 0348 08/20/19 0254  NA 137  --  140  --  138 136  K 4.1   < > 4.5   < > 4.8 5.0  CL 94*  --  96*  --  94* 93*  CO2 34*  --  34*  --  34* 33*  GLUCOSE 119*  --  165*  --  164* 151*  BUN 24*  --  32*  --  50* 51*  CREATININE 1.11  --  1.16  --  1.15 1.22  CALCIUM 9.3  --  9.2  --  9.2 9.1  MG  --   --  2.4  --   --  2.5*  PHOS  --   --   --   --   --  4.8*   < > = values in this interval not displayed.   Estimated Creatinine Clearance: 105.9 mL/min (by C-G formula based on SCr of 1.22 mg/dL).   LIVER Recent Labs  Lab 08/20/19 0254  AST 38  ALT 42  ALKPHOS 50    BILITOT 1.3*  PROT 6.3*  ALBUMIN 3.1*     INFECTIOUS Recent Labs  Lab 08/17/19 1406 08/18/19 0319  PROCALCITON 0.46 0.55     ENDOCRINE CBG (last 3)  No results for input(s): GLUCAP in the last 72 hours.       IMAGING x48h  - image(s) personally visualized  -   highlighted in bold CT CHEST W CONTRAST  Result Date: 08/19/2019 CLINICAL DATA:  Assess for pneumonia or pleural effusion. Assess for abscess. Hypoxia and respiratory failure. EXAM: CT CHEST WITH CONTRAST TECHNIQUE: Multidetector CT imaging of the chest was performed during intravenous contrast administration. CONTRAST:  65mL OMNIPAQUE IOHEXOL 300 MG/ML  SOLN COMPARISON:  None. FINDINGS: Cardiovascular: No significant vascular findings. The heart size is mildly enlarged. No pericardial effusion. Mediastinum/Nodes: There are mild prominent bilateral hilar lymph nodes which may be reactive lymph nodes. No mediastinal lymphadenopathy is identified. No axillary lymphadenopathy is identified. The thyroid glands, trachea and esophagus are normal. Lungs/Pleura: There are minimal bilateral pleural effusions. Mild dependent atelectasis of posterior right lung base is identified. There is no focal pneumonia in the right infrahilar and perihilar region. Tiny focus of density is identified in the posterior right apex favor focal scar or atelectasis. Upper Abdomen: Diffuse low density of the liver is identified without focal liver lesion. The other visualized upper abdominal structures are normal. Musculoskeletal: Minimal degenerative joint changes of thoracic spine are identified. IMPRESSION: 1. No focal pneumonia identified in the right infrahilar and perihilar region. 2. Minimal bilateral pleural effusions. 3. Fatty infiltration of liver. Electronically Signed   By: Sherian Rein M.D.   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  Unclear cause of wheezing whether this is reflective of sleep apnea versus uncontrolled asthma or diastolic heart failure  PLAN: Dc atenolol due to wheezing Increase frequency of DuoNeb Add Pulmicort neb Continue Lasix at current dose but with increasing hypercarbia/alkalosis might need to do Diamox Continue Solu-Medrol at current dosage of 20 mg every 12 hours -wheezing uncontrolled then I have to consider increasing Solu-Medrol Check blood IgE and allergy profile as  a surrogate marker for asthma with the understanding that steroids could cloud the picture Continue BiPAP nightly and daytime as needed Check blood gas 08/21/2019 -no determination on home BiPAP as yet.  Will need several days to few days in the hospital before making the determination based on blood gas and clinical profile  Long-term: Weight loss needed  Disposition: Can probably move to the floor bed on nighttime nightly BiPAP starting tomorrow   CCM will see the patient again August 21, 2019 (OR) August 22, 2019     SIGNATURE    Dr. Brand Males, M.D., F.C.C.P,  Pulmonary and Critical Care Medicine Staff Physician, [place] Director - Interstitial Lung Disease  Program  Pulmonary [place] [facility] [place], [place], [address]  Pager: [phone], If no answer or between  15:00h - 7:00h: call [phone] Telephone: [phone]  7:09 PM 08/20/2019

## 2019-08-20 NOTE — Progress Notes (Signed)
PROGRESS NOTE    Joseph [name]  PQZ:[id number] DOB: Jan 10, 1981 DOA: 08/17/2019 PCP: Catha Gosselin, MD   Brief Narrative: 39 year old with obesity, likely undiagnosed OSA, asthma moderate persistent on Advair and hypertension who presented with several days of progressive shortness of breath and subsequently hypoxemia.  In the ER, Covid test was negative, chest x-ray showed patchy pneumonia, he was hypoxic oxygen sat 60% which improved to 90 on 4 L nasal cannula.   Assessment & Plan:   Principal Problem:   Acute respiratory failure with hypoxia (HCC) Active Problems:   Obesity   Essential hypertension   Acute on chronic respiratory failure with hypoxia and hypercapnia (HCC)  1-Acute Hypoxic Respiratory Failure Multifactorial secondary to asthma exacerbation, exacerbated by viral illness, per pulmonologist appears to have decompensated cor pulmonary with secondary to undiagnosed sleep apnea. -Continue with oxygen supplementation, IV antibiotics. Day 4 -He was a started on BiPAP -start oral lasix.  -will need recommendation from CCM in regards Home BIPAP.  -IV steroids.   2-HTN; Continue with , lasix, Atenolol. Hold Norvasc to avoid hypotension, reassess need in am.   3-Likely Sleep apnea;  Needs sleep study.   4-Leg swelling; started  oral lasix.    Nutrition Problem: Increased nutrient needs(protein) Etiology: acute illness    Signs/Symptoms: estimated needs    Interventions: Prostat  Estimated body mass index is 44.47 kg/m as calculated from the following:   Height as of this encounter: 5\' 7"  (1.702 m).   Weight as of this encounter: 128.8 kg.   DVT prophylaxis: Lovenox Code Status: Full code Family Communication: wife was on phone during my rounds.  Disposition Plan:  Patient is from: Home  Anticipated d/c date: Home in 1--2 days.  Barriers to d/c or necessity for inpatient status: still requiring oxygen, diuretics, BIPAP. Will need to see if we can  arrange BIPAP for home  Consultants:   CCM  Procedures:     Antimicrobials:    Subjective: He is breathing ok, tolerated some what BIPAP, was difficult at the beginning.    Objective: Vitals:   08/20/19 0000 08/20/19 0200 08/20/19 0400 08/20/19 0500  BP: (!) 126/54 (!) 116/53 (!) 118/54   Pulse: 84 64 63   Resp: (!) 28 14    Temp: 98.8 F (37.1 C)  97.6 F (36.4 C)   TempSrc: Axillary  Axillary   SpO2: 96% 97% 96%   Weight:    128.8 kg  Height:        Intake/Output Summary (Last 24 hours) at 08/20/2019 0721 Last data filed at 08/19/2019 1720 Gross per 24 hour  Intake -  Output 1000 ml  Net -1000 ml   Filed Weights   08/18/19 0423 08/19/19 0430 08/20/19 0500  Weight: 129.4 kg 128.1 kg 128.8 kg    Examination:  General exam: Appears calm and comfortable  Respiratory system: decreased breath sounds.  Cardiovascular system: S1 & S2 heard, RRR. No JVD, murmurs, rubs, gallops or clicks. No pedal edema. Gastrointestinal system: Abdomen is nondistended, soft and nontender. No organomegaly or masses felt. Normal bowel sounds heard. Central nervous system: Alert and oriented. No focal neurological deficits. Extremities: Symmetric 5 x 5 power. Skin: No rashes, lesions or ulcers    Data Reviewed: I have personally reviewed following labs and imaging studies  CBC: Recent Labs  Lab 08/17/19 1219 08/18/19 0319 08/19/19 0348 08/20/19 0254  WBC 11.0* 12.5* 17.6* 13.6*  NEUTROABS 7.4  --   --   --   HGB 17.6* 17.8*  17.9* 17.3*  HCT 57.6* 57.4* 57.8* 54.6*  MCV 110.1* 110.8* 110.7* 107.7*  PLT 189 182 206 192   Basic Metabolic Panel: Recent Labs  Lab 08/17/19 1219 08/18/19 0319 08/19/19 0348 08/20/19 0254  NA 137 140 138 136  K 4.1 4.5 4.8 5.0  CL 94* 96* 94* 93*  CO2 34* 34* 34* 33*  GLUCOSE 119* 165* 164* 151*  BUN 24* 32* 50* 51*  CREATININE 1.11 1.16 1.15 1.22  CALCIUM 9.3 9.2 9.2 9.1  MG  --  2.4  --  2.5*  PHOS  --   --   --  4.8*    GFR: Estimated Creatinine Clearance: 105.9 mL/min (by C-G formula based on SCr of 1.22 mg/dL). Liver Function Tests: Recent Labs  Lab 08/20/19 0254  AST 38  ALT 42  ALKPHOS 50  BILITOT 1.3*  PROT 6.3*  ALBUMIN 3.1*   No results for input(s): LIPASE, AMYLASE in the last 168 hours. No results for input(s): AMMONIA in the last 168 hours. Coagulation Profile: No results for input(s): INR, PROTIME in the last 168 hours. Cardiac Enzymes: No results for input(s): CKTOTAL, CKMB, CKMBINDEX, TROPONINI in the last 168 hours. BNP (last 3 results) No results for input(s): PROBNP in the last 8760 hours. HbA1C: No results for input(s): HGBA1C in the last 72 hours. CBG: No results for input(s): GLUCAP in the last 168 hours. Lipid Profile: No results for input(s): CHOL, HDL, LDLCALC, TRIG, CHOLHDL, LDLDIRECT in the last 72 hours. Thyroid Function Tests: Recent Labs    08/17/19 1406  TSH 1.725   Anemia Panel: No results for input(s): VITAMINB12, FOLATE, FERRITIN, TIBC, IRON, RETICCTPCT in the last 72 hours. Sepsis Labs: Recent Labs  Lab 08/17/19 1406 08/18/19 0319  PROCALCITON 0.46 0.55    Recent Results (from the past 240 hour(s))  SARS CORONAVIRUS 2 (TAT 6-24 HRS) Nasopharyngeal Nasopharyngeal Swab     Status: None   Collection Time: 08/17/19  1:28 PM   Specimen: Nasopharyngeal Swab  Result Value Ref Range Status   SARS Coronavirus 2 NEGATIVE NEGATIVE Final    Comment: (NOTE) SARS-CoV-2 target nucleic acids are NOT DETECTED. The SARS-CoV-2 RNA is generally detectable in upper and lower respiratory specimens during the acute phase of infection. Negative results do not preclude SARS-CoV-2 infection, do not rule out co-infections with other pathogens, and should not be used as the sole basis for treatment or other patient management decisions. Negative results must be combined with clinical observations, patient history, and epidemiological information. The expected result  is Negative. Fact Sheet for Patients: HairSlick.no Fact Sheet for Healthcare Providers: quierodirigir.com This test is not yet approved or cleared by the [place] FDA and  has been authorized for detection and/or diagnosis of SARS-CoV-2 by FDA under an Emergency Use Authorization (EUA). This EUA will remain  in effect (meaning this test can be used) for the duration of the COVID-19 declaration under Section 56 4(b)(1) of the Act, 21 U.S.C. section 360bbb-3(b)(1), unless the authorization is terminated or revoked sooner. Performed at [facility], [address], [place], [place] [address]   Culture, sputum-assessment     Status: None   Collection Time: 08/17/19  2:43 PM   Specimen: Expectorated Sputum  Result Value Ref Range Status   Specimen Description EXPECTORATED SPUTUM  Final   Special Requests NONE  Final   Sputum evaluation   Final    THIS SPECIMEN IS ACCEPTABLE FOR SPUTUM CULTURE Performed at [facility], [address] [place], [place] [address]    Report Status 08/17/2019 FINAL  Final  Culture, respiratory     Status: None   Collection Time: 08/17/19  2:43 PM  Result Value Ref Range Status   Specimen Description   Final    EXPECTORATED SPUTUM Performed at [facility], [address], [place], [place] [address]    Special Requests   Final    NONE Reflexed from [id number] Performed at [facility], [address] [place], [place] [address]    Gram Stain   Final    MODERATE WBC PRESENT, PREDOMINANTLY PMN MODERATE GRAM POSITIVE RODS FEW GRAM POSITIVE COCCI RARE GRAM NEGATIVE RODS    Culture   Final    Consistent with normal respiratory flora. Performed at  [facility], [address] [place], [place] [address]    Report Status 08/19/2019 FINAL  Final  Respiratory Panel by PCR     Status: None   Collection Time: 08/19/19  6:45 PM    Specimen: Nasopharyngeal Swab; Respiratory  Result Value Ref Range Status   Adenovirus NOT DETECTED NOT DETECTED Final   Coronavirus 229E NOT DETECTED NOT DETECTED Final    Comment: (NOTE) The Coronavirus on the Respiratory Panel, DOES NOT test for the novel  Coronavirus (2019 nCoV)    Coronavirus HKU1 NOT DETECTED NOT DETECTED Final   Coronavirus NL63 NOT DETECTED NOT DETECTED Final   Coronavirus OC43 NOT DETECTED NOT DETECTED Final   Metapneumovirus NOT DETECTED NOT DETECTED Final   Rhinovirus / Enterovirus NOT DETECTED NOT DETECTED Final   Influenza A NOT DETECTED NOT DETECTED Final   Influenza B NOT DETECTED NOT DETECTED Final   Parainfluenza Virus 1 NOT DETECTED NOT DETECTED Final   Parainfluenza Virus 2 NOT DETECTED NOT DETECTED Final   Parainfluenza Virus 3 NOT DETECTED NOT DETECTED Final   Parainfluenza Virus 4 NOT DETECTED NOT DETECTED Final   Respiratory Syncytial Virus NOT DETECTED NOT DETECTED Final   Bordetella pertussis NOT DETECTED NOT DETECTED Final   Chlamydophila pneumoniae NOT DETECTED NOT DETECTED Final   Mycoplasma pneumoniae NOT DETECTED NOT DETECTED Final    Comment: Performed at [facility], [address], [place], [place] [address]         Radiology Studies: CT CHEST W CONTRAST  Result Date: 08/19/2019 CLINICAL DATA:  Assess for pneumonia or pleural effusion. Assess for abscess. Hypoxia and respiratory failure. EXAM: CT CHEST WITH CONTRAST TECHNIQUE: Multidetector CT imaging of the chest was performed during intravenous contrast administration. CONTRAST:  56mL OMNIPAQUE IOHEXOL 300 MG/ML  SOLN COMPARISON:  None. FINDINGS: Cardiovascular: No significant vascular findings. The heart size is mildly enlarged. No pericardial effusion. Mediastinum/Nodes: There are mild prominent bilateral hilar lymph nodes which may be reactive lymph nodes. No mediastinal lymphadenopathy is identified. No axillary lymphadenopathy is identified. The thyroid glands, trachea  and esophagus are normal. Lungs/Pleura: There are minimal bilateral pleural effusions. Mild dependent atelectasis of posterior right lung base is identified. There is no focal pneumonia in the right infrahilar and perihilar region. Tiny focus of density is identified in the posterior right apex favor focal scar or atelectasis. Upper Abdomen: Diffuse low density of the liver is identified without focal liver lesion. The other visualized upper abdominal structures are normal. Musculoskeletal: Minimal degenerative joint changes of thoracic spine are identified. IMPRESSION: 1. No focal pneumonia identified in the right infrahilar and perihilar region. 2. Minimal bilateral pleural effusions. 3. Fatty infiltration of liver. Electronically Signed   By:  Abelardo Diesel M.D.   On: 08/19/2019 13:02        Scheduled Meds: . amLODipine  10 mg Oral Daily  . atenolol  25 mg Oral Daily  . azithromycin  500 mg Oral Daily  . Chlorhexidine Gluconate Cloth  6 each Topical Daily  . enoxaparin (LOVENOX) injection  40 mg Subcutaneous Q24H  . feeding supplement (PRO-STAT SUGAR FREE 64)  30 mL Oral TID BM  . hydrochlorothiazide  12.5 mg Oral Daily  . ipratropium-albuterol  3 mL Nebulization TID  . irbesartan  300 mg Oral Daily  . mouth rinse  15 mL Mouth Rinse BID  . methylPREDNISolone (SOLU-MEDROL) injection  20 mg Intravenous Q12H   Continuous Infusions: . cefTRIAXone (ROCEPHIN)  IV 2 g (08/19/19 1617)     LOS: 3 days    Time spent: 35 minutes    Belkys A Regalado, MD [facility]   If 7PM-7AM, please contact night-coverage [email or web address]  08/20/2019, 7:21 AM

## 2019-08-20 NOTE — Progress Notes (Signed)
RT Note: Pt. had desaturation episode while on BiPAP and RT was notified, upon arrival to room pt. was noted to be 90% with moderate snoring noted,with saturations decreasing slowly down to 88%, BiPAP Inspiratory pressure adjusted along with set back up rate and FiO2.

## 2019-08-20 NOTE — Progress Notes (Signed)
Pt. was awake upon entering room, "Lab had just drawn blood, was sleeping well and pressures seemed fine", per pt.

## 2019-08-21 ENCOUNTER — Encounter (HOSPITAL_COMMUNITY): Payer: Self-pay | Admitting: Internal Medicine

## 2019-08-21 DIAGNOSIS — J45909 Unspecified asthma, uncomplicated: Secondary | ICD-10-CM

## 2019-08-21 DIAGNOSIS — E662 Morbid (severe) obesity with alveolar hypoventilation: Secondary | ICD-10-CM

## 2019-08-21 DIAGNOSIS — J9601 Acute respiratory failure with hypoxia: Secondary | ICD-10-CM

## 2019-08-21 LAB — BLOOD GAS, ARTERIAL
Acid-Base Excess: 11.3 mmol/L — ABNORMAL HIGH (ref 0.0–2.0)
Bicarbonate: 39.4 mmol/L — ABNORMAL HIGH (ref 20.0–28.0)
Drawn by: 308601
FIO2: 44
Mode: POSITIVE
O2 Saturation: 94.8 %
Patient temperature: 98.6
pCO2 arterial: 61.9 mmHg — ABNORMAL HIGH (ref 32.0–48.0)
pH, Arterial: 7.42 (ref 7.350–7.450)
pO2, Arterial: 74.7 mmHg — ABNORMAL LOW (ref 83.0–108.0)

## 2019-08-21 LAB — BASIC METABOLIC PANEL
Anion gap: 10 (ref 5–15)
BUN: 50 mg/dL — ABNORMAL HIGH (ref 6–20)
CO2: 36 mmol/L — ABNORMAL HIGH (ref 22–32)
Calcium: 9.2 mg/dL (ref 8.9–10.3)
Chloride: 91 mmol/L — ABNORMAL LOW (ref 98–111)
Creatinine, Ser: 1.15 mg/dL (ref 0.61–1.24)
GFR calc Af Amer: >60 mL/min (ref >60)
GFR calc non Af Amer: >60 mL/min (ref >60)
Glucose, Bld: 142 mg/dL — ABNORMAL HIGH (ref 70–99)
Potassium: 4.6 mmol/L (ref 3.5–5.1)
Sodium: 137 mmol/L (ref 135–145)

## 2019-08-21 LAB — PHOSPHORUS: Phosphorus: 4.1 mg/dL (ref 2.5–4.6)

## 2019-08-21 LAB — MAGNESIUM: Magnesium: 2.4 mg/dL (ref 1.7–2.4)

## 2019-08-21 MED ORDER — IPRATROPIUM-ALBUTEROL 0.5-2.5 (3) MG/3ML IN SOLN
3.0000 mL | Freq: Three times a day (TID) | RESPIRATORY_TRACT | Status: DC
  Administered 2019-08-21 – 2019-08-23 (×5): 3 mL via RESPIRATORY_TRACT
  Filled 2019-08-21 (×5): qty 3

## 2019-08-21 NOTE — Progress Notes (Signed)
PROGRESS NOTE    Joseph [name]  YSA:[id number] DOB: [birth year] DOA: 08/17/2019 PCP: Catha Gosselin, MD   Brief Narrative: 39 year old with obesity, likely undiagnosed OSA, asthma moderate persistent on Advair and hypertension who presented with several days of progressive shortness of breath and subsequently hypoxemia.  In the ER, Covid test was negative, chest x-ray showed patchy pneumonia, he was hypoxic oxygen sat 60% which improved to 90 on 4 L nasal cannula.   Assessment & Plan:   Principal Problem:   Acute respiratory failure with hypoxia (HCC) Active Problems:   Obesity   Essential hypertension   Acute on chronic respiratory failure with hypoxia and hypercapnia (HCC)  1-Acute Hypoxic Respiratory Failure Multifactorial secondary to asthma exacerbation, exacerbated by viral illness, per pulmonologist appears to have decompensated cor pulmonary with secondary to undiagnosed sleep apnea. -Continue with oxygen supplementation, IV antibiotics. Day 5. Last dose today.  -He was a started on BiPAP. Continue with BIPAP HS.  -start oral lasix.  -will need recommendation from CCM in regards Home BIPAP.  -IV steroids. Decreased to 20 mg IV BID.  -CO2 trending down on Blood gas with use of BIPAP/.  -Will discussed with Dr Marchelle Gearing in regards starting Diamox.   2-HTN; Continue with , lasix/ ate;ono; discontinue to avoid bronchospasm.  Hold Norvasc to avoid hypotension, reassess need in am.   3-Likely Sleep apnea;  Needs sleep study.   4-Leg swelling; started  oral lasix.    Nutrition Problem: Increased nutrient needs(protein) Etiology: acute illness    Signs/Symptoms: estimated needs    Interventions: Prostat  Estimated body mass index is 43.16 kg/m as calculated from the following:   Height as of this encounter: 5\' 7"  (1.702 m).   Weight as of this encounter: 125 kg.   DVT prophylaxis: Lovenox Code Status: Full code Family Communication: wife was on phone during  my rounds.  Disposition Plan:  Patient is from: Home  Anticipated d/c date: Home in 1--2 days.  Barriers to d/c or necessity for inpatient status: still requiring oxygen, diuretics, BIPAP. Will need to see if we can arrange BIPAP for home  Consultants:   CCM  Procedures:     Antimicrobials:    Subjective: He feels he is breathing better.  He feels is difficult to use Advair, feels he doesn't get enough meds.  Feels BIPAP has been helping.  Report less LE edema.  Just received nebulizer.   Objective: Vitals:   08/21/19 0400 08/21/19 0600 08/21/19 0713 08/21/19 0735  BP: 135/76 (!) 118/58    Pulse: 71 68    Resp: 15 15    Temp: 97.7 F (36.5 C)  98.2 F (36.8 C)   TempSrc: Axillary  Oral   SpO2: (!) 89% 92%  94%  Weight:  125 kg    Height:        Intake/Output Summary (Last 24 hours) at 08/21/2019 0756 Last data filed at 08/20/2019 1800 Gross per 24 hour  Intake 200 ml  Output -  Net 200 ml   Filed Weights   08/19/19 0430 08/20/19 0500 08/21/19 0600  Weight: 128.1 kg 128.8 kg 125 kg    Examination:  General exam: NAD Respiratory system: Bilateral expiratory wheezing.  Cardiovascular system: S 1, S 2 RRR Gastrointestinal system: BS present, soft, nt, obesed Central nervous system: Non focal Extremities: no edema Skin: no rashes    Data Reviewed: I have personally reviewed following labs and imaging studies  CBC: Recent Labs  Lab 08/17/19 1219 08/18/19 0319 08/19/19  1886 08/20/19 0254  WBC 11.0* 12.5* 17.6* 13.6*  NEUTROABS 7.4  --   --   --   HGB 17.6* 17.8* 17.9* 17.3*  HCT 57.6* 57.4* 57.8* 54.6*  MCV 110.1* 110.8* 110.7* 107.7*  PLT 189 182 206 192   Basic Metabolic Panel: Recent Labs  Lab 08/17/19 1219 08/18/19 0319 08/19/19 0348 08/20/19 0254 08/21/19 0233  NA 137 140 138 136 137  K 4.1 4.5 4.8 5.0 4.6  CL 94* 96* 94* 93* 91*  CO2 34* 34* 34* 33* 36*  GLUCOSE 119* 165* 164* 151* 142*  BUN 24* 32* 50* 51* 50*  CREATININE 1.11  1.16 1.15 1.22 1.15  CALCIUM 9.3 9.2 9.2 9.1 9.2  MG  --  2.4  --  2.5* 2.4  PHOS  --   --   --  4.8* 4.1   GFR: Estimated Creatinine Clearance: 110.5 mL/min (by C-G formula based on SCr of 1.15 mg/dL). Liver Function Tests: Recent Labs  Lab 08/20/19 0254  AST 38  ALT 42  ALKPHOS 50  BILITOT 1.3*  PROT 6.3*  ALBUMIN 3.1*   No results for input(s): LIPASE, AMYLASE in the last 168 hours. No results for input(s): AMMONIA in the last 168 hours. Coagulation Profile: No results for input(s): INR, PROTIME in the last 168 hours. Cardiac Enzymes: No results for input(s): CKTOTAL, CKMB, CKMBINDEX, TROPONINI in the last 168 hours. BNP (last 3 results) No results for input(s): PROBNP in the last 8760 hours. HbA1C: No results for input(s): HGBA1C in the last 72 hours. CBG: No results for input(s): GLUCAP in the last 168 hours. Lipid Profile: No results for input(s): CHOL, HDL, LDLCALC, TRIG, CHOLHDL, LDLDIRECT in the last 72 hours. Thyroid Function Tests: No results for input(s): TSH, T4TOTAL, FREET4, T3FREE, THYROIDAB in the last 72 hours. Anemia Panel: No results for input(s): VITAMINB12, FOLATE, FERRITIN, TIBC, IRON, RETICCTPCT in the last 72 hours. Sepsis Labs: Recent Labs  Lab 08/17/19 1406 08/18/19 0319  PROCALCITON 0.46 0.55    Recent Results (from the past 240 hour(s))  SARS CORONAVIRUS 2 (TAT 6-24 HRS) Nasopharyngeal Nasopharyngeal Swab     Status: None   Collection Time: 08/17/19  1:28 PM   Specimen: Nasopharyngeal Swab  Result Value Ref Range Status   SARS Coronavirus 2 NEGATIVE NEGATIVE Final    Comment: (NOTE) SARS-CoV-2 target nucleic acids are NOT DETECTED. The SARS-CoV-2 RNA is generally detectable in upper and lower respiratory specimens during the acute phase of infection. Negative results do not preclude SARS-CoV-2 infection, do not rule out co-infections with other pathogens, and should not be used as the sole basis for treatment or other patient  management decisions. Negative results must be combined with clinical observations, patient history, and epidemiological information. The expected result is Negative. Fact Sheet for Patients: HairSlick.no Fact Sheet for Healthcare Providers: quierodirigir.com This test is not yet approved or cleared by the [place] FDA and  has been authorized for detection and/or diagnosis of SARS-CoV-2 by FDA under an Emergency Use Authorization (EUA). This EUA will remain  in effect (meaning this test can be used) for the duration of the COVID-19 declaration under Section 56 4(b)(1) of the Act, 21 U.S.C. section 360bbb-3(b)(1), unless the authorization is terminated or revoked sooner. Performed at [facility], [address], [place], [place] [address]   Culture, sputum-assessment     Status: None   Collection Time: 08/17/19  2:43 PM   Specimen: Expectorated Sputum  Result Value Ref Range Status  Specimen Description EXPECTORATED SPUTUM  Final   Special Requests NONE  Final   Sputum evaluation   Final    THIS SPECIMEN IS ACCEPTABLE FOR SPUTUM CULTURE Performed at [facility], [address] [place], [place] [address]    Report Status 08/17/2019 FINAL  Final  Culture, respiratory     Status: None   Collection Time: 08/17/19  2:43 PM  Result Value Ref Range Status   Specimen Description   Final    EXPECTORATED SPUTUM Performed at [facility], [address] [place], [place] [address]    Special Requests   Final    NONE Reflexed from [id number] Performed at [facility], [address] [place], [place] [address]    Gram Stain   Final    MODERATE WBC PRESENT, PREDOMINANTLY PMN MODERATE GRAM POSITIVE RODS FEW GRAM POSITIVE COCCI RARE GRAM NEGATIVE RODS    Culture   Final    Consistent with normal respiratory flora. Performed at [facility], [address], [place], [place] [address]    Report Status 08/19/2019 FINAL  Final  Respiratory Panel by PCR     Status: None   Collection Time: 08/19/19  6:45 PM   Specimen: Nasopharyngeal Swab; Respiratory  Result Value Ref Range Status   Adenovirus NOT DETECTED NOT DETECTED Final   Coronavirus 229E NOT DETECTED NOT DETECTED Final    Comment: (NOTE) The Coronavirus on the Respiratory Panel, DOES NOT test for the novel  Coronavirus (2019 nCoV)    Coronavirus HKU1 NOT DETECTED NOT DETECTED Final   Coronavirus NL63 NOT DETECTED NOT DETECTED Final   Coronavirus OC43 NOT DETECTED NOT DETECTED Final   Metapneumovirus NOT DETECTED NOT DETECTED Final   Rhinovirus / Enterovirus NOT DETECTED NOT DETECTED Final   Influenza A NOT DETECTED NOT DETECTED Final   Influenza B NOT DETECTED NOT DETECTED Final   Parainfluenza Virus 1 NOT DETECTED NOT DETECTED Final   Parainfluenza Virus 2 NOT DETECTED NOT DETECTED Final   Parainfluenza Virus 3 NOT DETECTED NOT DETECTED Final   Parainfluenza Virus 4 NOT DETECTED NOT DETECTED Final   Respiratory Syncytial Virus NOT DETECTED NOT DETECTED Final   Bordetella pertussis NOT DETECTED NOT DETECTED Final   Chlamydophila pneumoniae NOT DETECTED NOT DETECTED Final   Mycoplasma pneumoniae NOT DETECTED NOT DETECTED Final    Comment: Performed at [facility], [address] [place], [place] [address]  MRSA PCR Screening     Status: None   Collection Time: 08/20/19  9:20 PM   Specimen: Nasal Mucosa; Nasopharyngeal  Result Value Ref Range Status   MRSA by PCR NEGATIVE NEGATIVE Final    Comment:        The GeneXpert MRSA Assay (FDA approved for NASAL specimens only), is one component of a comprehensive MRSA colonization surveillance program. It is not intended to diagnose MRSA infection nor to guide or monitor treatment for MRSA infections. Performed at [facility], [address] [place], [place] [address]          Radiology  Studies: CT CHEST W CONTRAST  Result Date: 08/19/2019 CLINICAL DATA:  Assess for pneumonia or pleural effusion. Assess for abscess. Hypoxia and respiratory failure. EXAM: CT CHEST WITH CONTRAST TECHNIQUE: Multidetector CT imaging of the chest was performed during intravenous contrast administration. CONTRAST:  25mL OMNIPAQUE IOHEXOL 300 MG/ML  SOLN COMPARISON:  None. FINDINGS: Cardiovascular: No significant vascular findings. The heart size is mildly enlarged. No pericardial  effusion. Mediastinum/Nodes: There are mild prominent bilateral hilar lymph nodes which may be reactive lymph nodes. No mediastinal lymphadenopathy is identified. No axillary lymphadenopathy is identified. The thyroid glands, trachea and esophagus are normal. Lungs/Pleura: There are minimal bilateral pleural effusions. Mild dependent atelectasis of posterior right lung base is identified. There is no focal pneumonia in the right infrahilar and perihilar region. Tiny focus of density is identified in the posterior right apex favor focal scar or atelectasis. Upper Abdomen: Diffuse low density of the liver is identified without focal liver lesion. The other visualized upper abdominal structures are normal. Musculoskeletal: Minimal degenerative joint changes of thoracic spine are identified. IMPRESSION: 1. No focal pneumonia identified in the right infrahilar and perihilar region. 2. Minimal bilateral pleural effusions. 3. Fatty infiltration of liver. Electronically Signed   By: Abelardo Diesel M.D.   On: 08/19/2019 13:02        Scheduled Meds: . azithromycin  500 mg Oral Daily  . budesonide (PULMICORT) nebulizer solution  0.25 mg Nebulization BID  . Chlorhexidine Gluconate Cloth  6 each Topical Daily  . enoxaparin (LOVENOX) injection  40 mg Subcutaneous Q24H  . feeding supplement (PRO-STAT SUGAR FREE 64)  30 mL Oral TID BM  . furosemide  40 mg Oral Daily  . ipratropium-albuterol  3 mL Nebulization Q4H  . irbesartan  300 mg Oral  Daily  . mouth rinse  15 mL Mouth Rinse BID  . methylPREDNISolone (SOLU-MEDROL) injection  20 mg Intravenous Q12H   Continuous Infusions: . cefTRIAXone (ROCEPHIN)  IV Stopped (08/20/19 1652)     LOS: 4 days    Time spent: 35 minutes    Arcadia Gorgas A Amaliya Whitelaw, MD [facility]   If 7PM-7AM, please contact night-coverage [email or web address]  08/21/2019, 7:56 AM

## 2019-08-21 NOTE — Progress Notes (Signed)
PT MAY BENEFIT FROM A NEBULIZER FOR HOME. PT DOESN'T FEEL THE ADVAIR IS WORKING AND WOULD LIKE TO TRY ANOTHER PREVENTIVE INHALER/DPI

## 2019-08-21 NOTE — Progress Notes (Signed)
ABG obtained on BIPAP 12/6 with 6 LPM O2 bleed in.  Results for [facility], Joseph [name] (MRN [id number]) as of 08/21/2019 04:45  Ref. Range 08/21/2019 04:25  Sample type Unknown ARTERIAL  FIO2 Unknown 44.00  Mode Unknown BILEVEL POSITIVE AIRWAY PRESSURE  pH, Arterial Latest Ref Range: 7.350 - 7.450  7.420  pCO2 arterial Latest Ref Range: 32.0 - 48.0 mmHg 61.9 (H)  pO2, Arterial Latest Ref Range: 83.0 - 108.0 mmHg 74.7 (L)  Acid-Base Excess Latest Ref Range: 0.0 - 2.0 mmol/L 11.3 (H)  Bicarbonate Latest Ref Range: 20.0 - 28.0 mmol/L 39.4 (H)  O2 Saturation Latest Units: % 94.8  Patient temperature Unknown 98.6  Collection site Unknown RIGHT RADIAL  Allens test (pass/fail) Latest Ref Range: PASS  PASS

## 2019-08-21 NOTE — Consult Note (Signed)
NAME:  Joseph [name], MRN:  [id number], DOB:  1980/11/01, LOS: 4 ADMISSION DATE:  08/17/2019, CONSULTATION DATE:  3/27 REFERRING MD:  Tyrell Antonio, CHIEF COMPLAINT:  hypercapnia   Brief History   OHS, OSA Acute on chronic hypercapneic and hypoxic respiratory failure  History of present illness   39 year old male former smoker who quit 10 years ago and he reports increasing weight gain over the last 5 years to current weight of 288 pounds.  He has lifelong history of asthma for which he takes Advair and albuterol as needed.  This is prescribed by his primary care physician and has not seen a pulmonologist in the past.  He works in a warehouse loading and unloading.  He is able to complete his daily activities without problems until approximately 2 weeks ago at which time he noticed increasing shortness of breath and fatigue.  Endorses a cough with clear sputum wheezing which was more than normal and dyspnea to the point that he sought medical treatment. Arterial blood gases on 40% FiO2 showed pH of 7.30 carbon dioxide of 72 and a PO2 of 179 with a bicarbonate of 35 chemistries show a total CO2 of 34.  His chest x-ray revealed borderline cardiomegaly and right greater than left airspace disease. Currently he is requiring 4 L nasal cannula to maintain sats greater than 88% pulmonary critical care was called to consult at this time.  He is awake and alert in no acute distress at time of examination.  Has been on Advair, but needing albuterol more recently prior to admission, and appropriate benefit in symptoms.  Past Medical History  Asthma HTN  Significant Hospital Events   ICU for BiPAP initiation on 3/26-3/28  Consults:  PCCM  Procedures:    Significant Diagnostic Tests:  CT chest 3/26- cardiomegaly, right atelectasis.  Small pleural effusion with overlying atelectasis.  No significant opacities.  Micro Data:  RVP negative resp culture 3/24- moderate WBC, NF covid  negative  Antimicrobials:  Azithromycin 3/24-3/28 Ceftriaxone 3/24>>  Interim history/subjective:  On BiPAP presently.  Slept well other than being woken up frequently.  Feels well, more alert.  Objective   Blood pressure 122/66, pulse 90, temperature 98.3 F (36.8 C), temperature source Oral, resp. rate 18, height 5\' 7"  (1.702 m), weight 125 kg, SpO2 98 %.        Intake/Output Summary (Last 24 hours) at 08/21/2019 1709 Last data filed at 08/21/2019 1643 Gross per 24 hour  Intake 200 ml  Output 825 ml  Net -625 ml   Filed Weights   08/19/19 0430 08/20/19 0500 08/21/19 0600  Weight: 128.1 kg 128.8 kg 125 kg    Examination: General: Middle-age man acute distress HENT: Lloyd/AT, eyes anicteric Neck: Enlarged circumference, no cervical adenopathy Lungs: Bibasilar rales, and expiratory wheezing throughout on both sides. Cardiovascular: Regular rate and rhythm, no murmurs Abdomen: Obese, soft, nontender Extremities: Edema to the knees bilaterally with venous stasis changes Neuro: Awake and alert, answering questions appropriately, moving all extremities spontaneously Derm: Venous stasis changes bilateral lower extremities  Resolved Hospital Problem list     Assessment & Plan:  Acute on chronic hypoxic and hypercapnic respiratory failure due to obesity hypoventilation syndrome and OSA -Continue BiPAP with supplemental O2. Titrate down O2 as able. -Needs formal outpatient in-lab PSG & follow up with sleep medicine doctor. -Supplemental oxygen as required to gain SPO2 88 to 92%.  Avoid hyperoxia. -Continue diuresis Lasix 40 mg once daily.  Daily standing weights, strict I's/O. -Recommend modest weight  loss as a long-term goal -BNP tomorrow  Chronic asthma -Continue Pulmicort twice daily while admitted -Resume home Advair at discharge -Albuterol PRN  Polycythemia-suggests significant chronic hypoxia -Management of OSA and OHS  Best practice:  Per primary  Labs    CBC: Recent Labs  Lab 08/17/19 1219 08/18/19 0319 08/19/19 0348 08/20/19 0254  WBC 11.0* 12.5* 17.6* 13.6*  NEUTROABS 7.4  --   --   --   HGB 17.6* 17.8* 17.9* 17.3*  HCT 57.6* 57.4* 57.8* 54.6*  MCV 110.1* 110.8* 110.7* 107.7*  PLT 189 182 206 192    Basic Metabolic Panel: Recent Labs  Lab 08/17/19 1219 08/18/19 0319 08/19/19 0348 08/20/19 0254 08/21/19 0233  NA 137 140 138 136 137  K 4.1 4.5 4.8 5.0 4.6  CL 94* 96* 94* 93* 91*  CO2 34* 34* 34* 33* 36*  GLUCOSE 119* 165* 164* 151* 142*  BUN 24* 32* 50* 51* 50*  CREATININE 1.11 1.16 1.15 1.22 1.15  CALCIUM 9.3 9.2 9.2 9.1 9.2  MG  --  2.4  --  2.5* 2.4  PHOS  --   --   --  4.8* 4.1   GFR: Estimated Creatinine Clearance: 110.5 mL/min (by C-G formula based on SCr of 1.15 mg/dL). Recent Labs  Lab 08/17/19 1219 08/17/19 1406 08/18/19 0319 08/19/19 0348 08/20/19 0254  PROCALCITON  --  0.46 0.55  --   --   WBC 11.0*  --  12.5* 17.6* 13.6*    Liver Function Tests: Recent Labs  Lab 08/20/19 0254  AST 38  ALT 42  ALKPHOS 50  BILITOT 1.3*  PROT 6.3*  ALBUMIN 3.1*   No results for input(s): LIPASE, AMYLASE in the last 168 hours. No results for input(s): AMMONIA in the last 168 hours.  ABG    Component Value Date/Time   PHART 7.420 08/21/2019 0425   PCO2ART 61.9 (H) 08/21/2019 0425   PO2ART 74.7 (L) 08/21/2019 0425   HCO3 39.4 (H) 08/21/2019 0425   O2SAT 94.8 08/21/2019 0425     Coagulation Profile: No results for input(s): INR, PROTIME in the last 168 hours.  Cardiac Enzymes: No results for input(s): CKTOTAL, CKMB, CKMBINDEX, TROPONINI in the last 168 hours.  HbA1C: No results found for: HGBA1C  CBG: No results for input(s): GLUCAP in the last 168 hours.    Steffanie Dunn, DO 08/21/19 5:25 PM [facility]

## 2019-08-21 NOTE — Plan of Care (Signed)

## 2019-08-22 LAB — LEGIONELLA PNEUMOPHILA SEROGP 1 UR AG: L. pneumophila Serogp 1 Ur Ag: NEGATIVE

## 2019-08-22 LAB — CBC
HCT: 58.4 % — ABNORMAL HIGH (ref 39.0–52.0)
Hemoglobin: 19.2 g/dL — ABNORMAL HIGH (ref 13.0–17.0)
MCH: 34.3 pg — ABNORMAL HIGH (ref 26.0–34.0)
MCHC: 32.9 g/dL (ref 30.0–36.0)
MCV: 104.3 fL — ABNORMAL HIGH (ref 80.0–100.0)
Platelets: 177 10*3/uL (ref 150–400)
RBC: 5.6 MIL/uL (ref 4.22–5.81)
RDW: 14.3 % (ref 11.5–15.5)
WBC: 9.1 10*3/uL (ref 4.0–10.5)
nRBC: 0 % (ref 0.0–0.2)

## 2019-08-22 LAB — BASIC METABOLIC PANEL
Anion gap: 10 (ref 5–15)
BUN: 38 mg/dL — ABNORMAL HIGH (ref 6–20)
CO2: 35 mmol/L — ABNORMAL HIGH (ref 22–32)
Calcium: 9.7 mg/dL (ref 8.9–10.3)
Chloride: 91 mmol/L — ABNORMAL LOW (ref 98–111)
Creatinine, Ser: 1.02 mg/dL (ref 0.61–1.24)
GFR calc Af Amer: >60 mL/min (ref >60)
GFR calc non Af Amer: >60 mL/min (ref >60)
Glucose, Bld: 109 mg/dL — ABNORMAL HIGH (ref 70–99)
Potassium: 5.4 mmol/L — ABNORMAL HIGH (ref 3.5–5.1)
Sodium: 136 mmol/L (ref 135–145)

## 2019-08-22 LAB — MAGNESIUM: Magnesium: 2.5 mg/dL — ABNORMAL HIGH (ref 1.7–2.4)

## 2019-08-22 LAB — PHOSPHORUS: Phosphorus: 3.5 mg/dL (ref 2.5–4.6)

## 2019-08-22 LAB — BRAIN NATRIURETIC PEPTIDE: B Natriuretic Peptide: 43 pg/mL (ref 0.0–100.0)

## 2019-08-22 MED ORDER — SODIUM ZIRCONIUM CYCLOSILICATE 5 G PO PACK
5.0000 g | PACK | Freq: Once | ORAL | Status: AC
  Administered 2019-08-22: 5 g via ORAL
  Filled 2019-08-22: qty 1

## 2019-08-22 MED ORDER — FUROSEMIDE 20 MG PO TABS: 20.0000 mg | ORAL_TABLET | Freq: Every day | ORAL | Status: DC

## 2019-08-22 MED ORDER — ASPIRIN EC 81 MG PO TBEC
81.0000 mg | DELAYED_RELEASE_TABLET | Freq: Every day | ORAL | Status: DC
  Administered 2019-08-22 – 2019-08-23 (×2): 81 mg via ORAL
  Filled 2019-08-22 (×2): qty 1

## 2019-08-22 MED ORDER — FUROSEMIDE 20 MG PO TABS
20.0000 mg | ORAL_TABLET | Freq: Every day | ORAL | Status: DC
  Administered 2019-08-23: 20 mg via ORAL
  Filled 2019-08-22: qty 1

## 2019-08-22 NOTE — Progress Notes (Signed)
PROGRESS NOTE    Joseph [name]  GEX:[id number] DOB: [birth year] DOA: 08/17/2019 PCP: Catha Gosselin, MD   Brief Narrative: 39 year old with obesity, likely undiagnosed OSA, asthma moderate persistent on Advair and hypertension who presented with several days of progressive shortness of breath and subsequently hypoxemia.  In the ER, Covid test was negative, chest x-ray showed patchy pneumonia, he was hypoxic oxygen sat 60% which improved to 90 on 4 L nasal cannula.   Assessment & Plan:   Principal Problem:   Acute respiratory failure with hypoxia (HCC) Active Problems:   Obesity   Essential hypertension   Acute on chronic respiratory failure with hypoxia and hypercapnia (HCC)  1-Acute Hypoxic Respiratory Failure Multifactorial secondary to asthma exacerbation, exacerbated by viral illness, per pulmonologist appears to have decompensated cor pulmonary with secondary to undiagnosed sleep apnea. -Continue with oxygen supplementation, IV antibiotics. Completed 5 days of antibiotics.  -IV steroids. Decreased to 20 mg IV BID.  -CO2 trending down on Blood gas with use of BIPAP/.  -Trying to arrange [facility] for home use. \ Hold lasix due to increase hb. Resume tomorrow, TED hose  2-HTN; Continue with , lasix/ ate;ono; discontinue to avoid bronchospasm.  Hold Norvasc to avoid hypotension, reassess need in am.  Hold irbesartan due to increase K.   3-Likely Sleep apnea;  Needs sleep study.   4-Leg swelling; started  oral lasix.  Ted hose.   5-Hemoconcentration;  Might be related to chronic hypoxemia.  Will hold Lasix today. Start baby aspirin.   Nutrition Problem: Increased nutrient needs(protein) Etiology: acute illness    Signs/Symptoms: estimated needs    Interventions: Prostat  Estimated body mass index is 42.96 kg/m as calculated from the following:   Height as of this encounter: 5\' 7"  (1.702 m).   Weight as of this encounter: 124.4 kg.   DVT prophylaxis:  Lovenox Code Status: Full code Family Communication: Care discussed with patient directly Disposition Plan:  Patient is from: Home  Anticipated d/c date: Home in 1--2 days.  Barriers to d/c or necessity for inpatient status: still requiring oxygen, diuretics, BIPAP. Will need to see if we can arrange BIPAP for home  Consultants:   CCM  Procedures:     Antimicrobials:    Subjective: He is breathing better, he is tolerating well BiPAP at night.   Objective: Vitals:   08/21/19 2205 08/22/19 0241 08/22/19 0554 08/22/19 0749  BP: 136/71 124/74 132/73   Pulse: 79 70 68   Resp: 19 18 20    Temp: 97.9 F (36.6 C) 98.1 F (36.7 C) 97.7 F (36.5 C)   TempSrc: Oral Oral Oral   SpO2: 94% 90% 95% 92%  Weight:   124.4 kg   Height:        Intake/Output Summary (Last 24 hours) at 08/22/2019 0840 Last data filed at 08/22/2019 0730 Gross per 24 hour  Intake 0 ml  Output 2225 ml  Net -2225 ml   Filed Weights   08/20/19 0500 08/21/19 0600 08/22/19 0554  Weight: 128.8 kg 125 kg 124.4 kg    Examination:  General exam: NAD Respiratory system: Sporadic rhonchus on the right Cardiovascular system: S1, S2 regular rhythm or rate Gastrointestinal system: Bowel sounds present, soft nontender nondistended Central nervous system: Alert nonfocal Extremities; trace edema Skin: no rashes    Data Reviewed: I have personally reviewed following labs and imaging studies  CBC: Recent Labs  Lab 08/17/19 1219 08/18/19 0319 08/19/19 0348 08/20/19 0254 08/22/19 0453  WBC 11.0* 12.5* 17.6* 13.6* 9.1  NEUTROABS 7.4  --   --   --   --   HGB 17.6* 17.8* 17.9* 17.3* 19.2*  HCT 57.6* 57.4* 57.8* 54.6* 58.4*  MCV 110.1* 110.8* 110.7* 107.7* 104.3*  PLT 189 182 206 192 177   Basic Metabolic Panel: Recent Labs  Lab 08/18/19 0319 08/19/19 0348 08/20/19 0254 08/21/19 0233 08/22/19 0453  NA 140 138 136 137 136  K 4.5 4.8 5.0 4.6 5.4*  CL 96* 94* 93* 91* 91*  CO2 34* 34* 33* 36* 35*   GLUCOSE 165* 164* 151* 142* 109*  BUN 32* 50* 51* 50* 38*  CREATININE 1.16 1.15 1.22 1.15 1.02  CALCIUM 9.2 9.2 9.1 9.2 9.7  MG 2.4  --  2.5* 2.4 2.5*  PHOS  --   --  4.8* 4.1 3.5   GFR: Estimated Creatinine Clearance: 124.2 mL/min (by C-G formula based on SCr of 1.02 mg/dL). Liver Function Tests: Recent Labs  Lab 08/20/19 0254  AST 38  ALT 42  ALKPHOS 50  BILITOT 1.3*  PROT 6.3*  ALBUMIN 3.1*   No results for input(s): LIPASE, AMYLASE in the last 168 hours. No results for input(s): AMMONIA in the last 168 hours. Coagulation Profile: No results for input(s): INR, PROTIME in the last 168 hours. Cardiac Enzymes: No results for input(s): CKTOTAL, CKMB, CKMBINDEX, TROPONINI in the last 168 hours. BNP (last 3 results) No results for input(s): PROBNP in the last 8760 hours. HbA1C: No results for input(s): HGBA1C in the last 72 hours. CBG: No results for input(s): GLUCAP in the last 168 hours. Lipid Profile: No results for input(s): CHOL, HDL, LDLCALC, TRIG, CHOLHDL, LDLDIRECT in the last 72 hours. Thyroid Function Tests: No results for input(s): TSH, T4TOTAL, FREET4, T3FREE, THYROIDAB in the last 72 hours. Anemia Panel: No results for input(s): VITAMINB12, FOLATE, FERRITIN, TIBC, IRON, RETICCTPCT in the last 72 hours. Sepsis Labs: Recent Labs  Lab 08/17/19 1406 08/18/19 0319  PROCALCITON 0.46 0.55    Recent Results (from the past 240 hour(s))  SARS CORONAVIRUS 2 (TAT 6-24 HRS) Nasopharyngeal Nasopharyngeal Swab     Status: None   Collection Time: 08/17/19  1:28 PM   Specimen: Nasopharyngeal Swab  Result Value Ref Range Status   SARS Coronavirus 2 NEGATIVE NEGATIVE Final    Comment: (NOTE) SARS-CoV-2 target nucleic acids are NOT DETECTED. The SARS-CoV-2 RNA is generally detectable in upper and lower respiratory specimens during the acute phase of infection. Negative results do not preclude SARS-CoV-2 infection, do not rule out co-infections with other pathogens,  and should not be used as the sole basis for treatment or other patient management decisions. Negative results must be combined with clinical observations, patient history, and epidemiological information. The expected result is Negative. Fact Sheet for Patients: HairSlick.no Fact Sheet for Healthcare Providers: quierodirigir.com This test is not yet approved or cleared by the [place] FDA and  has been authorized for detection and/or diagnosis of SARS-CoV-2 by FDA under an Emergency Use Authorization (EUA). This EUA will remain  in effect (meaning this test can be used) for the duration of the COVID-19 declaration under Section 56 4(b)(1) of the Act, 21 U.S.C. section 360bbb-3(b)(1), unless the authorization is terminated or revoked sooner. Performed at [facility], [address], [place], [place] [address]   Culture, sputum-assessment     Status: None   Collection Time: 08/17/19  2:43 PM   Specimen: Expectorated Sputum  Result Value Ref Range Status   Specimen Description EXPECTORATED SPUTUM  Final  Special Requests NONE  Final   Sputum evaluation   Final    THIS SPECIMEN IS ACCEPTABLE FOR SPUTUM CULTURE Performed at [facility] [address], [place], [place] [address]    Report Status 08/17/2019 FINAL  Final  Culture, respiratory     Status: None   Collection Time: 08/17/19  2:43 PM  Result Value Ref Range Status   Specimen Description   Final    EXPECTORATED SPUTUM Performed at [facility] [address], [place], [place] [address]    Special Requests   Final    NONE Reflexed from [id number] Performed at [facility], [place] [address] [place], [place] [address]    Gram Stain   Final    MODERATE WBC PRESENT, PREDOMINANTLY PMN MODERATE GRAM POSITIVE RODS FEW GRAM POSITIVE COCCI RARE GRAM NEGATIVE RODS    Culture   Final    Consistent with  normal respiratory flora. Performed at [facility], [place] [address] [place], [place] [address]    Report Status 08/19/2019 FINAL  Final  Respiratory Panel by PCR     Status: None   Collection Time: 08/19/19  6:45 PM   Specimen: Nasopharyngeal Swab; Respiratory  Result Value Ref Range Status   Adenovirus NOT DETECTED NOT DETECTED Final   Coronavirus 229E NOT DETECTED NOT DETECTED Final    Comment: (NOTE) The Coronavirus on the Respiratory Panel, DOES NOT test for the novel  Coronavirus (2019 nCoV)    Coronavirus HKU1 NOT DETECTED NOT DETECTED Final   Coronavirus NL63 NOT DETECTED NOT DETECTED Final   Coronavirus OC43 NOT DETECTED NOT DETECTED Final   Metapneumovirus NOT DETECTED NOT DETECTED Final   Rhinovirus / Enterovirus NOT DETECTED NOT DETECTED Final   Influenza A NOT DETECTED NOT DETECTED Final   Influenza B NOT DETECTED NOT DETECTED Final   Parainfluenza Virus 1 NOT DETECTED NOT DETECTED Final   Parainfluenza Virus 2 NOT DETECTED NOT DETECTED Final   Parainfluenza Virus 3 NOT DETECTED NOT DETECTED Final   Parainfluenza Virus 4 NOT DETECTED NOT DETECTED Final   Respiratory Syncytial Virus NOT DETECTED NOT DETECTED Final   Bordetella pertussis NOT DETECTED NOT DETECTED Final   Chlamydophila pneumoniae NOT DETECTED NOT DETECTED Final   Mycoplasma pneumoniae NOT DETECTED NOT DETECTED Final    Comment: Performed at [facility], [place] [address] [place], [place] [address]  MRSA PCR Screening     Status: None   Collection Time: 08/20/19  9:20 PM   Specimen: Nasal Mucosa; Nasopharyngeal  Result Value Ref Range Status   MRSA by PCR NEGATIVE NEGATIVE Final    Comment:        The GeneXpert MRSA Assay (FDA approved for NASAL specimens only), is one component of a comprehensive MRSA colonization surveillance program. It is not intended to diagnose MRSA infection nor to guide or monitor treatment for MRSA infections. Performed at [facility], [place] [address] [place], [place] [address]          Radiology Studies: No results found.      Scheduled Meds: . budesonide (PULMICORT) nebulizer solution  0.25 mg Nebulization BID  . Chlorhexidine Gluconate Cloth  6 each Topical Daily  . enoxaparin (LOVENOX) injection  40 mg Subcutaneous Q24H  . feeding supplement (PRO-STAT SUGAR FREE 64)  30 mL Oral TID BM  . furosemide  20 mg Oral Daily  . ipratropium-albuterol  3 mL Nebulization TID  . mouth rinse  15  mL Mouth Rinse BID  . methylPREDNISolone (SOLU-MEDROL) injection  20 mg Intravenous Q12H  . sodium zirconium cyclosilicate  5 g Oral Once   Continuous Infusions:    LOS: 5 days    Time spent: 35 minutes    Brooksie Ellwanger A Nadyne Gariepy, MD [facility]   If 7PM-7AM, please contact night-coverage [email or web address]  08/22/2019, 8:40 AM

## 2019-08-22 NOTE — Progress Notes (Signed)
SATURATION QUALIFICATIONS: (This note is used to comply with regulatory documentation for home oxygen)  Patient Saturations on Room Air at Rest = 94%  Patient Saturations on Room Air while Ambulating = 92%  Patient Saturations on N/A Liters of oxygen while Ambulating = N/A  Please briefly explain why patient needs home oxygen:  Pt does not qualify for continuous home oxygen.

## 2019-08-22 NOTE — Consult Note (Addendum)
NAME:  Joseph [name], MRN:  [id number], DOB:  06-09-1980, LOS: 5 ADMISSION DATE:  08/17/2019, CONSULTATION DATE:  3/27 REFERRING MD:  Tyrell Antonio, CHIEF COMPLAINT:  hypercapnia   Brief History   OHS, OSA Acute on chronic hypercapneic and hypoxic respiratory failure  History of present illness   39 year old male former smoker who quit 10 years ago and he reports increasing weight gain over the last 5 years to current weight of 288 pounds.  He has lifelong history of asthma for which he takes Advair and albuterol as needed.  This is prescribed by his primary care physician and has not seen a pulmonologist in the past.  He works in a warehouse loading and unloading.  He is able to complete his daily activities without problems until approximately 2 weeks ago at which time he noticed increasing shortness of breath and fatigue.  Endorses a cough with clear sputum wheezing which was more than normal and dyspnea to the point that he sought medical treatment.  Arterial blood gases on 40% FiO2 showed pH of 7.30 carbon dioxide of 72 and a PO2 of 179 with a bicarbonate of 35 chemistries show a total CO2 of 34.  His chest x-ray revealed borderline cardiomegaly and right greater than left airspace disease. Currently he is requiring 4 L nasal cannula to maintain sats greater than 88% pulmonary critical care was called to consult at this time.  He is awake and alert in no acute distress at time of examination.  Has been on Advair, but needing albuterol more recently prior to admission, and appropriate benefit in symptoms.  Past Medical History  Asthma HTN  Significant Hospital Events   ICU for BiPAP initiation on 3/26-3/28  Consults:  PCCM  Procedures:    Significant Diagnostic Tests:  CT chest 3/26- cardiomegaly, right atelectasis.  Small pleural effusion with overlying atelectasis.  No significant opacities.  Micro Data:  RVP negative resp culture 3/24- moderate WBC, NF covid  negative  Antimicrobials:  Azithromycin 3/24-3/28 Ceftriaxone 3/24>>  Interim history/subjective:  Sitting up on side of bed, stated he ambulated well today with only one episode of slight dyspnea.   Objective   Blood pressure 132/73, pulse (!) 104, temperature 97.7 F (36.5 C), temperature source Oral, resp. rate 20, height 5\' 7"  (1.702 m), weight 124.4 kg, SpO2 92 %.        Intake/Output Summary (Last 24 hours) at 08/22/2019 1217 Last data filed at 08/22/2019 0730 Gross per 24 hour  Intake 0 ml  Output 2225 ml  Net -2225 ml   Filed Weights   08/20/19 0500 08/21/19 0600 08/22/19 0554  Weight: 128.8 kg 125 kg 124.4 kg    Examination: General: Very pleasant middle aged male sitting on edge of bed in NAD HEENT: [place]/AT, MM pink/moist, PERRL,  Neuro: Alert and oriented x3  CV: s1s2 regular rate and rhythm, no murmur, rubs, or gallops,  PULM:  Clear to ascultation bilaterally, no increased work of breathing  GI: soft, bowel sounds active in all 4 quadrants, non-tender, non-distended Extremities: warm/dry, no edema  Skin: no rashes or lesions, venus stasis changes to bilateral lower extremities   Resolved Hospital Problem list     Assessment & Plan:  Acute on chronic hypoxic and hypercapnic respiratory failure due to obesity hypoventilation syndrome and OSA Plan: Continue BIPAP and supplemental oxygen, sat goal  greater than 88% Wean pxygen as able  Needs follow up with sleep doctor at discharge  Avoid hyperoxia  Continue to diurese  Daily weight  Strict intake and output  Encourage for modest weight loss as a long term goal  Trend BNP  Chronic asthma Plan: Continue Pulmicort BID during admission  Continue home Advair at discharge  Albuterol PRN  Polycythemia -suggests significant chronic hypoxia Plan: Management of OSA and OHS as above    PCCM will sign off. Thank you for the opportunity to participate in this patient's care. Please contact if we can be of  further assistance.  Best practice:  Per primary  Labs   CBC: Recent Labs  Lab 08/17/19 1219 08/18/19 0319 08/19/19 0348 08/20/19 0254 08/22/19 0453  WBC 11.0* 12.5* 17.6* 13.6* 9.1  NEUTROABS 7.4  --   --   --   --   HGB 17.6* 17.8* 17.9* 17.3* 19.2*  HCT 57.6* 57.4* 57.8* 54.6* 58.4*  MCV 110.1* 110.8* 110.7* 107.7* 104.3*  PLT 189 182 206 192 177    Basic Metabolic Panel: Recent Labs  Lab 08/18/19 0319 08/19/19 0348 08/20/19 0254 08/21/19 0233 08/22/19 0453  NA 140 138 136 137 136  K 4.5 4.8 5.0 4.6 5.4*  CL 96* 94* 93* 91* 91*  CO2 34* 34* 33* 36* 35*  GLUCOSE 165* 164* 151* 142* 109*  BUN 32* 50* 51* 50* 38*  CREATININE 1.16 1.15 1.22 1.15 1.02  CALCIUM 9.2 9.2 9.1 9.2 9.7  MG 2.4  --  2.5* 2.4 2.5*  PHOS  --   --  4.8* 4.1 3.5   GFR: Estimated Creatinine Clearance: 124.2 mL/min (by C-G formula based on SCr of 1.02 mg/dL). Recent Labs  Lab 08/17/19 1219 08/17/19 1406 08/18/19 0319 08/19/19 0348 08/20/19 0254 08/22/19 0453  PROCALCITON  --  0.46 0.55  --   --   --   WBC   < >  --  12.5* 17.6* 13.6* 9.1   < > = values in this interval not displayed.    Liver Function Tests: Recent Labs  Lab 08/20/19 0254  AST 38  ALT 42  ALKPHOS 50  BILITOT 1.3*  PROT 6.3*  ALBUMIN 3.1*   No results for input(s): LIPASE, AMYLASE in the last 168 hours. No results for input(s): AMMONIA in the last 168 hours.  ABG    Component Value Date/Time   PHART 7.420 08/21/2019 0425   PCO2ART 61.9 (H) 08/21/2019 0425   PO2ART 74.7 (L) 08/21/2019 0425   HCO3 39.4 (H) 08/21/2019 0425   O2SAT 94.8 08/21/2019 0425     Coagulation Profile: No results for input(s): INR, PROTIME in the last 168 hours.  Cardiac Enzymes: No results for input(s): CKTOTAL, CKMB, CKMBINDEX, TROPONINI in the last 168 hours.  HbA1C: No results found for: HGBA1C  CBG: No results for input(s): GLUCAP in the last 168 hours.  Signature  Delfin Gant, NP-C [facility] Contact / Pager information can be found on Amion  08/22/2019, 12:24 PM

## 2019-08-22 NOTE — TOC Initial Note (Signed)
Transition of Care [facility]) - Initial/Assessment Note    Patient Details  Name: Joseph [name] MRN: [id number] Date of Birth: 09-12-1980  Transition of Care [facility] CM/SW Contact:    Lanier Clam, RN Phone Number: 08/22/2019, 12:14 PM  Clinical Narrative:CM referral for bipap-patient has not had a sleep study therefor would need otpt sleep study. [facility] dme can do Non Invasive Vent(NIV)-MD/Critical care informed-form on shadow chart for signature if agree. [employer] following for neb machine, & NIV if form signed.Patient in agreement.                   Expected Discharge Plan: Home/Self Care Barriers to Discharge: Continued Medical Work up   Patient Goals and CMS Choice Patient states their goals for this hospitalization and ongoing recovery are:: go home      Expected Discharge Plan and Services Expected Discharge Plan: Home/Self Care   Discharge Planning Services: CM Consult   Living arrangements for the past 2 months: Single Family Home                 DME Arranged: Nebulizer machine DME Agency: [employer] Date DME Agency Contacted: 08/22/19 Time DME Agency Contacted: 1213 Representative spoke with at DME Agency: Ian Malkin            Prior Living Arrangements/Services Living arrangements for the past 2 months: Single Family Home Lives with:: Spouse Patient language and need for interpreter reviewed:: Yes Do you feel safe going back to the place where you live?: Yes      Need for Family Participation in Patient Care: No (Comment)     Criminal Activity/Legal Involvement Pertinent to Current Situation/Hospitalization: No - Comment as needed  Activities of Daily Living Home Assistive Devices/Equipment: None ADL Screening (condition at time of admission) Patient's cognitive ability adequate to safely complete daily activities?: Yes Is the patient deaf or have difficulty hearing?: No Does the patient have difficulty seeing, even when wearing  glasses/contacts?: No Does the patient have difficulty concentrating, remembering, or making decisions?: No Patient able to express need for assistance with ADLs?: Yes Does the patient have difficulty dressing or bathing?: No Independently performs ADLs?: Yes (appropriate for developmental age) Does the patient have difficulty walking or climbing stairs?: No Weakness of Legs: None Weakness of Arms/Hands: None  Permission Sought/Granted Permission sought to share information with : Case Manager Permission granted to share information with : Yes, Verbal Permission Granted  Share Information with NAME: Case Manager           Emotional Assessment Appearance:: Appears stated age Attitude/Demeanor/Rapport: Gracious Affect (typically observed): Accepting Orientation: : Oriented to Self, Oriented to Place, Oriented to  Time, Oriented to Situation Alcohol / Substance Use: Not Applicable Psych Involvement: No (comment)  Admission diagnosis:  Severe persistent asthma with status asthmaticus [J45.52] Acute respiratory failure with hypoxia (HCC) [J96.01] Acute on chronic respiratory failure with hypoxia and hypercapnia (HCC) [K74.25, J96.22] Patient Active Problem List   Diagnosis Date Noted  . Acute on chronic respiratory failure with hypoxia and hypercapnia (HCC)   . Acute respiratory failure with hypoxia (HCC) 08/17/2019  . Obesity 08/17/2019  . Essential hypertension 08/17/2019   PCP:  Catha Gosselin, MD Pharmacy:   [facility] - [place], [place] - [address] [facility] [address] [place] [address] Phone: [phone] Fax: [phone]     Social Determinants of Health (SDOH) Interventions    Readmission Risk Interventions No flowsheet data found.

## 2019-08-22 NOTE — Care Management (Signed)
Patient continues to exhibit signs of hypercapnia associated with chronic respiratory failure secondary to severe restrictive thoracic disorder due to obesity hypoventilation syndrome. Patient requires the use of NIV both QHS and daytime to help with exacerbation periods. The use of the NIV will treat patient's high PC02 levels and can reduce risk of exacerbations and future hospitalizations when used at night and during the day. Patient will need these advanced settings in conjunction with current medication regimen;BIPAP is not an option due to its functional limitations and the severity of the patient's condition. Failure to have NIV available for use over a 24hr period could lead to death. Patient is able to protect their airways and clear secretions on their own.

## 2019-08-22 NOTE — Plan of Care (Signed)
  Problem: Education: Goal: Knowledge of General Education information will improve Description: Including pain rating scale, medication(s)/side effects and non-pharmacologic comfort measures Outcome: Progressing   Problem: Health Behavior/Discharge Planning: Goal: Ability to manage health-related needs will improve Outcome: Progressing   Problem: Clinical Measurements: Goal: Ability to maintain clinical measurements within normal limits will improve Outcome: Progressing Goal: Will remain free from infection Outcome: Progressing Goal: Respiratory complications will improve Outcome: Progressing Goal: Cardiovascular complication will be avoided Outcome: Progressing   Problem: Activity: Goal: Risk for activity intolerance will decrease Outcome: Progressing   Problem: Nutrition: Goal: Adequate nutrition will be maintained Outcome: Progressing   Problem: Coping: Goal: Level of anxiety will decrease Outcome: Progressing   Problem: Elimination: Goal: Will not experience complications related to bowel motility Outcome: Completed/Met Goal: Will not experience complications related to urinary retention Outcome: Completed/Met   Problem: Pain Managment: Goal: General experience of comfort will improve Outcome: Progressing   Problem: Safety: Goal: Ability to remain free from injury will improve Outcome: Progressing   Problem: Skin Integrity: Goal: Risk for impaired skin integrity will decrease Outcome: Progressing   Problem: Education: Goal: Knowledge of risk factors and measures for prevention of condition will improve Outcome: Not Applicable   Problem: Coping: Goal: Psychosocial and spiritual needs will be supported Outcome: Not Applicable   Problem: Respiratory: Goal: Will maintain a patent airway Outcome: Not Applicable Goal: Complications related to the disease process, condition or treatment will be avoided or minimized Outcome: Not Applicable

## 2019-08-22 NOTE — Plan of Care (Signed)

## 2019-08-23 ENCOUNTER — Telehealth: Payer: Self-pay | Admitting: Pulmonary Disease

## 2019-08-23 ENCOUNTER — Telehealth: Payer: Self-pay | Admitting: Critical Care Medicine

## 2019-08-23 LAB — CBC
HCT: 57.6 % — ABNORMAL HIGH (ref 39.0–52.0)
Hemoglobin: 18.8 g/dL — ABNORMAL HIGH (ref 13.0–17.0)
MCH: 33.6 pg (ref 26.0–34.0)
MCHC: 32.6 g/dL (ref 30.0–36.0)
MCV: 103 fL — ABNORMAL HIGH (ref 80.0–100.0)
Platelets: 167 10*3/uL (ref 150–400)
RBC: 5.59 MIL/uL (ref 4.22–5.81)
RDW: 14 % (ref 11.5–15.5)
WBC: 9.7 10*3/uL (ref 4.0–10.5)
nRBC: 0 % (ref 0.0–0.2)

## 2019-08-23 LAB — BASIC METABOLIC PANEL
Anion gap: 13 (ref 5–15)
BUN: 37 mg/dL — ABNORMAL HIGH (ref 6–20)
CO2: 28 mmol/L (ref 22–32)
Calcium: 9.3 mg/dL (ref 8.9–10.3)
Chloride: 93 mmol/L — ABNORMAL LOW (ref 98–111)
Creatinine, Ser: 0.97 mg/dL (ref 0.61–1.24)
GFR calc Af Amer: >60 mL/min (ref >60)
GFR calc non Af Amer: >60 mL/min (ref >60)
Glucose, Bld: 111 mg/dL — ABNORMAL HIGH (ref 70–99)
Potassium: 4.9 mmol/L (ref 3.5–5.1)
Sodium: 134 mmol/L — ABNORMAL LOW (ref 135–145)

## 2019-08-23 LAB — ERYTHROPOIETIN: Erythropoietin: 3.9 m[IU]/mL (ref 2.6–18.5)

## 2019-08-23 LAB — MAGNESIUM: Magnesium: 2.5 mg/dL — ABNORMAL HIGH (ref 1.7–2.4)

## 2019-08-23 LAB — PHOSPHORUS: Phosphorus: 4.2 mg/dL (ref 2.5–4.6)

## 2019-08-23 MED ORDER — FUROSEMIDE 20 MG PO TABS: 20.0000 mg | ORAL_TABLET | Freq: Every day | ORAL | 0 refills | Status: AC

## 2019-08-23 MED ORDER — PREDNISONE 10 MG PO TABS: ORAL_TABLET | ORAL | 0 refills | Status: DC

## 2019-08-23 MED ORDER — IPRATROPIUM-ALBUTEROL 0.5-2.5 (3) MG/3ML IN SOLN: 3.0000 mL | Freq: Four times a day (QID) | RESPIRATORY_TRACT | 0 refills | Status: DC | PRN

## 2019-08-23 MED ORDER — ASPIRIN 81 MG PO TBEC: 81.0000 mg | DELAYED_RELEASE_TABLET | Freq: Every day | ORAL | 0 refills | Status: AC

## 2019-08-23 NOTE — TOC Transition Note (Signed)
Transition of Care [facility]) - CM/SW Discharge Note   Patient Details  Name: Joseph [name] MRN: [id number] Date of Birth: 20-Jan-1981  Transition of Care [facility]) CM/SW Contact:  Lanier Clam, RN Phone Number: 08/23/2019, 10:08 AM   Clinical Narrative:  Patient was approved for NIV-[employer] rep Ian Malkin is working w/patient on the financial obligation of co pay-CM informed patient that this is his obligation with his insurance-patient voiced understanding.No further CM needs.     Final next level of care: Home/Self Care Barriers to Discharge: No Barriers Identified   Patient Goals and CMS Choice Patient states their goals for this hospitalization and ongoing recovery are:: go home      Discharge Placement                       Discharge Plan and Services   Discharge Planning Services: CM Consult            DME Arranged: Nebulizer machine DME Agency: [employer] Date DME Agency Contacted: 08/22/19 Time DME Agency Contacted: 1213 Representative spoke with at DME Agency: Ian Malkin            Social Determinants of Health (SDOH) Interventions     Readmission Risk Interventions No flowsheet data found.

## 2019-08-23 NOTE — Telephone Encounter (Signed)
There is not an order for a sleep study.  Will route to triage.

## 2019-08-23 NOTE — Telephone Encounter (Signed)
Called and spoke to pt. Appt scheduled with Dr. Wynona Neat on 4/20 for a sleep consult. Pt verbalized understanding and denied any further questions or concerns at this time.

## 2019-08-23 NOTE — Telephone Encounter (Signed)
Schedule the patient to be seen in the office

## 2019-08-23 NOTE — Discharge Summary (Signed)
Physician Discharge Summary  Joseph [name]:[id number] DOB: 11-Jan-1981 DOA: 08/17/2019  PCP: Hulan Fess, MD  Admit date: 08/17/2019 Discharge date: 08/23/2019  Admitted From:Home  Disposition: Home   Recommendations for Outpatient Follow-up:  1. Follow up with PCP in 1-2 weeks 2. Please obtain BMP/CBC in one week 3. Please follow up on the following pending results: Needs to follow up with pulmonologist for further care. EPO level.   Home Health: none Equipment/Devices: Nebulizer machine.   Discharge Condition: Stable.  CODE STATUS: Full code Diet recommendation: Heart Healthy   Brief/Interim Summary: 39 year old with obesity, likely undiagnosed OSA, asthma moderate persistent on Advair and hypertension who presented with several days of progressive shortness of breath and subsequently hypoxemia.  In the ER, Covid test was negative, chest x-ray showed patchy pneumonia, he was hypoxic oxygen sat 60% which improved to 90 on 4 L nasal cannula.  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 -Continue with oxygen supplementation, IV antibiotics. Completed 5 days of antibiotics.  -IV steroids. Decreased to 20 mg IV BID.  -CO2 trending down on Blood gas with use of BIPAP/.  -CM arrange [facility] for home use. Patient is not able to afford co-pay.  -discharge on low dose lasix. -Oxygen sat on RA and ambulation above 93.   2-HTN; Continue with , lasix/ ate;ono; discontinue to avoid bronchospasm.  Hold Norvasc to avoid hypotension, reassess need in am.  Hold irbesartan due to increase K.   3-Likely Sleep apnea;  Needs sleep study.   4-Leg swelling; started  oral lasix.  Ted hose.   5-Hemoconcentration;  Might be related to chronic hypoxemia.   Follow EPO level.  Started  baby  aspirin.    Discharge Diagnoses:  Principal Problem:   Acute respiratory failure with hypoxia ([place]) Active Problems:   Obesity   Essential hypertension   Acute on chronic respiratory failure with hypoxia and hypercapnia [facility]    Discharge Instructions  Discharge Instructions    Diet - low sodium heart healthy   Complete by: As directed    Increase activity slowly   Complete by: As directed      Allergies as of 08/23/2019   No Known Allergies     Medication List    STOP taking these medications   atenolol 25 MG tablet Commonly known as: TENORMIN   valsartan-hydrochlorothiazide 320-12.5 MG tablet Commonly known as: DIOVAN-HCT     TAKE these medications   Advair Diskus 250-50 MCG/DOSE Aepb Generic drug: Fluticasone-Salmeterol Inhale 1 puff into the lungs 2 (two) times daily.   albuterol 108 (90 Base) MCG/ACT inhaler Commonly known as: VENTOLIN HFA Inhale 2 puffs into the lungs every 6 (six) hours as needed for wheezing or shortness of breath.   amLODipine 10 MG tablet Commonly known as: NORVASC Take 10 mg by mouth daily.   aspirin 81 MG EC tablet Take 1 tablet (81 mg total) by mouth daily.   furosemide 20 MG tablet Commonly known as: LASIX Take 1 tablet (20 mg total) by mouth daily.   guaiFENesin 600 MG 12 hr tablet Commonly known as: MUCINEX Take 600 mg by mouth 2 (two) times daily as needed for cough or to loosen phlegm.   ipratropium-albuterol 0.5-2.5 (3) MG/3ML Soln Commonly known as: DUONEB Take 3 mLs by nebulization every 6 (six) hours as needed.   predniSONE 10 MG tablet Commonly known as: DELTASONE Take 4  tablets for 3 days then 3 tablets for 3 days then stop.            Durable Medical Equipment  (From admission, onward)         Start     Ordered   08/21/19 0756  For home use only DME Nebulizer machine  Once    Question Answer Comment  Patient needs a nebulizer to treat with the following condition Asthma   Length of Need 6  Months      08/21/19 0755         Follow-up Information    Kalman Shan, MD Follow up in 1 week(s).   Specialty: Pulmonary Disease Contact information: [address] [place] [address] [phone]          No Known Allergies  Consultations:  CCM   Procedures/Studies: CT CHEST W CONTRAST  Result Date: 08/19/2019 CLINICAL DATA:  Assess for pneumonia or pleural effusion. Assess for abscess. Hypoxia and respiratory failure. EXAM: CT CHEST WITH CONTRAST TECHNIQUE: Multidetector CT imaging of the chest was performed during intravenous contrast administration. CONTRAST:  75mL OMNIPAQUE IOHEXOL 300 MG/ML  SOLN COMPARISON:  None. FINDINGS: Cardiovascular: No significant vascular findings. The heart size is mildly enlarged. No pericardial effusion. Mediastinum/Nodes: There are mild prominent bilateral hilar lymph nodes which may be reactive lymph nodes. No mediastinal lymphadenopathy is identified. No axillary lymphadenopathy is identified. The thyroid glands, trachea and esophagus are normal. Lungs/Pleura: There are minimal bilateral pleural effusions. Mild dependent atelectasis of posterior right lung base is identified. There is no focal pneumonia in the right infrahilar and perihilar region. Tiny focus of density is identified in the posterior right apex favor focal scar or atelectasis. Upper Abdomen: Diffuse low density of the liver is identified without focal liver lesion. The other visualized upper abdominal structures are normal. Musculoskeletal: Minimal degenerative joint changes of thoracic spine are identified. IMPRESSION: 1. No focal pneumonia identified in the right infrahilar and perihilar region. 2. Minimal bilateral pleural effusions. 3. Fatty infiltration of liver. Electronically Signed   By: Sherian Rein M.D.   On: 08/19/2019 13:02   DG Chest Port 1 View  Result Date: 08/17/2019 CLINICAL DATA:  Increased shortness of breath. Difficulty breathing for 2  days. Decreased oxygen saturation. EXAM: PORTABLE CHEST 1 VIEW COMPARISON:  None. FINDINGS: Mild cardiomegaly. Bilateral peribronchial thickening with patchy airspace disease in the right perihilar and infrahilar lung zone. No significant pleural effusion. No visualized pneumothorax. No acute osseous abnormalities are seen. IMPRESSION: 1. Mild cardiomegaly. Peribronchial thickening likely related to history of asthma, pulmonary edema felt less likely. 2. Patchy airspace disease in the right perihilar and infrahilar lung may be atelectasis or pneumonia. Electronically Signed   By: Narda Rutherford M.D.   On: 08/17/2019 13:03   ECHOCARDIOGRAM COMPLETE  Result Date: 08/17/2019    ECHOCARDIOGRAM REPORT   Patient Name:   Joseph [name] [facility] Date of Exam: 08/17/2019 Medical Rec #:  [id number]        Height:       67.0 in Accession #:    [id number]       Weight:       280.0 lb Date of Birth:  1980-11-07        BSA:          2.333 m Patient Age:    38 years         BP:           124/69 mmHg Patient Gender: M  HR:           94 bpm. Exam Location:  Inpatient Procedure: 2D Echo Indications:    dyspnea 786.09  History:        Patient has no prior history of Echocardiogram examinations.                 Risk Factors:Hypertension.  Sonographer:    Celene SkeenVijay Shankar RDCS (AE) Referring Phys: [id number] ERIC J [place] IMPRESSIONS  1. Left ventricular ejection fraction, by estimation, is 60 to 65%. The left ventricle has normal function. The left ventricle has no regional wall motion abnormalities. Left ventricular diastolic parameters were normal.  2. Right ventricular systolic function is normal. The right ventricular size is normal.  3. The mitral valve is normal in structure. No evidence of mitral valve regurgitation. No evidence of mitral stenosis.  4. The aortic valve is tricuspid. Aortic valve regurgitation is not visualized. No aortic stenosis is present.  5. Shadowing artifact seen.  6. The inferior vena cava is normal  in size with greater than 50% respiratory variability, suggesting right atrial pressure of 3 mmHg. FINDINGS  Left Ventricle: Left ventricular ejection fraction, by estimation, is 60 to 65%. The left ventricle has normal function. The left ventricle has no regional wall motion abnormalities. The left ventricular internal cavity size was normal in size. There is  no left ventricular hypertrophy. Left ventricular diastolic parameters were normal. Right Ventricle: The right ventricular size is normal. No increase in right ventricular wall thickness. Right ventricular systolic function is normal. Left Atrium: Left atrial size was normal in size. Right Atrium: Right atrial size was normal in size. Pericardium: There is no evidence of pericardial effusion. Mitral Valve: The mitral valve is normal in structure. There is mild thickening of the mitral valve leaflet(s). There is mild calcification of the mitral valve leaflet(s). Normal mobility of the mitral valve leaflets. Mild mitral annular calcification. No evidence of mitral valve regurgitation. No evidence of mitral valve stenosis. Tricuspid Valve: The tricuspid valve is normal in structure. Tricuspid valve regurgitation is trivial. No evidence of tricuspid stenosis. Aortic Valve: The aortic valve is tricuspid. Aortic valve regurgitation is not visualized. No aortic stenosis is present. Pulmonic Valve: The pulmonic valve was normal in structure. Pulmonic valve regurgitation is not visualized. No evidence of pulmonic stenosis. Aorta: Shadowing artifact seen. The aortic root is normal in size and structure. Venous: The inferior vena cava is normal in size with greater than 50% respiratory variability, suggesting right atrial pressure of 3 mmHg. IAS/Shunts: The interatrial septum was not well visualized.  LEFT VENTRICLE PLAX 2D LVIDd:         5.60 cm  Diastology LVIDs:         3.30 cm  LV e' lateral:   11.20 cm/s LV PW:         1.00 cm  LV E/e' lateral: 11.7 LV IVS:         1.00 cm  LV e' medial:    7.72 cm/s LVOT diam:     2.50 cm  LV E/e' medial:  17.0 LV SV:         102 LV SV Index:   44 LVOT Area:     4.91 cm  LEFT ATRIUM           Index LA diam:      4.40 cm 1.89 cm/m LA Vol (A2C): 54.7 ml 23.45 ml/m  AORTIC VALVE LVOT Vmax:   107.00 cm/s LVOT Vmean:  75.600 cm/s  LVOT VTI:    0.207 m  AORTA Ao Root diam: 3.30 cm MITRAL VALVE MV Area (PHT): 3.42 cm     SHUNTS MV Decel Time: 222 msec     Systemic VTI:  0.21 m MV E velocity: 131.00 cm/s  Systemic Diam: 2.50 cm MV A velocity: 88.10 cm/s MV E/A ratio:  1.49 Charlton Haws MD Electronically signed by Charlton Haws MD Signature Date/Time: 08/17/2019/4:21:14 PM    Final       Subjective: He is feeling better.   Discharge Exam: Vitals:   08/23/19 0509 08/23/19 0815  BP: (!) 141/79   Pulse: 70   Resp: 18   Temp: 97.6 F (36.4 C)   SpO2: 94% 94%     General: Pt is alert, awake, not in acute distress Cardiovascular: RRR, S1/S2 +, no rubs, no gallops Respiratory: CTA bilaterally, no wheezing, no rhonchi Abdominal: Soft, NT, ND, bowel sounds + Extremities: no edema, no cyanosis    The results of significant diagnostics from this hospitalization (including imaging, microbiology, ancillary and laboratory) are listed below for reference.     Microbiology: Recent Results (from the past 240 hour(s))  SARS CORONAVIRUS 2 (TAT 6-24 HRS) Nasopharyngeal Nasopharyngeal Swab     Status: None   Collection Time: 08/17/19  1:28 PM   Specimen: Nasopharyngeal Swab  Result Value Ref Range Status   SARS Coronavirus 2 NEGATIVE NEGATIVE Final    Comment: (NOTE) SARS-CoV-2 target nucleic acids are NOT DETECTED. The SARS-CoV-2 RNA is generally detectable in upper and lower respiratory specimens during the acute phase of infection. Negative results do not preclude SARS-CoV-2 infection, do not rule out co-infections with other pathogens, and should not be used as the sole basis for treatment or other patient management  decisions. Negative results must be combined with clinical observations, patient history, and epidemiological information. The expected result is Negative. Fact Sheet for Patients: HairSlick.no Fact Sheet for Healthcare Providers: quierodirigir.com This test is not yet approved or cleared by the [place] FDA and  has been authorized for detection and/or diagnosis of SARS-CoV-2 by FDA under an Emergency Use Authorization (EUA). This EUA will remain  in effect (meaning this test can be used) for the duration of the COVID-19 declaration under Section 56 4(b)(1) of the Act, 21 U.S.C. section 360bbb-3(b)(1), unless the authorization is terminated or revoked sooner. Performed at [facility], [address] [place], [place] [address]   Culture, sputum-assessment     Status: None   Collection Time: 08/17/19  2:43 PM   Specimen: Expectorated Sputum  Result Value Ref Range Status   Specimen Description EXPECTORATED SPUTUM  Final   Special Requests NONE  Final   Sputum evaluation   Final    THIS SPECIMEN IS ACCEPTABLE FOR SPUTUM CULTURE Performed at [facility], [address] [place], [place] [address]    Report Status 08/17/2019 FINAL  Final  Culture, respiratory     Status: None   Collection Time: 08/17/19  2:43 PM  Result Value Ref Range Status   Specimen Description   Final    EXPECTORATED SPUTUM Performed at [facility], [address], [place], [place] [address]    Special Requests   Final    NONE Reflexed from [id number] Performed at [facility], [address], [place], [place] [address]    Gram Stain   Final    MODERATE WBC PRESENT, PREDOMINANTLY PMN MODERATE GRAM POSITIVE RODS FEW GRAM POSITIVE COCCI RARE Romie Minus  NEGATIVE RODS    Culture   Final    Consistent with normal respiratory flora. Performed at [facility], [address],  [place], [place] [address]    Report Status 08/19/2019 FINAL  Final  Respiratory Panel by PCR     Status: None   Collection Time: 08/19/19  6:45 PM   Specimen: Nasopharyngeal Swab; Respiratory  Result Value Ref Range Status   Adenovirus NOT DETECTED NOT DETECTED Final   Coronavirus 229E NOT DETECTED NOT DETECTED Final    Comment: (NOTE) The Coronavirus on the Respiratory Panel, DOES NOT test for the novel  Coronavirus (2019 nCoV)    Coronavirus HKU1 NOT DETECTED NOT DETECTED Final   Coronavirus NL63 NOT DETECTED NOT DETECTED Final   Coronavirus OC43 NOT DETECTED NOT DETECTED Final   Metapneumovirus NOT DETECTED NOT DETECTED Final   Rhinovirus / Enterovirus NOT DETECTED NOT DETECTED Final   Influenza A NOT DETECTED NOT DETECTED Final   Influenza B NOT DETECTED NOT DETECTED Final   Parainfluenza Virus 1 NOT DETECTED NOT DETECTED Final   Parainfluenza Virus 2 NOT DETECTED NOT DETECTED Final   Parainfluenza Virus 3 NOT DETECTED NOT DETECTED Final   Parainfluenza Virus 4 NOT DETECTED NOT DETECTED Final   Respiratory Syncytial Virus NOT DETECTED NOT DETECTED Final   Bordetella pertussis NOT DETECTED NOT DETECTED Final   Chlamydophila pneumoniae NOT DETECTED NOT DETECTED Final   Mycoplasma pneumoniae NOT DETECTED NOT DETECTED Final    Comment: Performed at [facility], [address], [place], [place] [address]  MRSA PCR Screening     Status: None   Collection Time: 08/20/19  9:20 PM   Specimen: Nasal Mucosa; Nasopharyngeal  Result Value Ref Range Status   MRSA by PCR NEGATIVE NEGATIVE Final    Comment:        The GeneXpert MRSA Assay (FDA approved for NASAL specimens only), is one component of a comprehensive MRSA colonization surveillance program. It is not intended to diagnose MRSA infection nor to guide or monitor treatment for MRSA infections. Performed at [facility], [address] [place], [place] [address]      Labs: BNP (last 3  results) Recent Labs    08/17/19 1221 08/22/19 0453  BNP 182.7* 43.0   Basic Metabolic Panel: Recent Labs  Lab 08/18/19 0319 08/18/19 0319 08/19/19 0348 08/20/19 0254 08/21/19 0233 08/22/19 0453 08/23/19 0523  NA 140   < > 138 136 137 136 134*  K 4.5   < > 4.8 5.0 4.6 5.4* 4.9  CL 96*   < > 94* 93* 91* 91* 93*  CO2 34*   < > 34* 33* 36* 35* 28  GLUCOSE 165*   < > 164* 151* 142* 109* 111*  BUN 32*   < > 50* 51* 50* 38* 37*  CREATININE 1.16   < > 1.15 1.22 1.15 1.02 0.97  CALCIUM 9.2   < > 9.2 9.1 9.2 9.7 9.3  MG 2.4  --   --  2.5* 2.4 2.5* 2.5*  PHOS  --   --   --  4.8* 4.1 3.5 4.2   < > = values in this interval not displayed.   Liver Function Tests: Recent Labs  Lab 08/20/19 0254  AST 38  ALT 42  ALKPHOS 50  BILITOT 1.3*  PROT 6.3*  ALBUMIN 3.1*   No results for input(s): LIPASE, AMYLASE in the last 168 hours. No results for input(s): AMMONIA in the last 168 hours.  CBC: Recent Labs  Lab 08/17/19 1219 08/17/19 1219 08/18/19 0319 08/19/19 0348 08/20/19 0254 08/22/19 0453 08/23/19 0523  WBC 11.0*   < > 12.5* 17.6* 13.6* 9.1 9.7  NEUTROABS 7.4  --   --   --   --   --   --   HGB 17.6*   < > 17.8* 17.9* 17.3* 19.2* 18.8*  HCT 57.6*   < > 57.4* 57.8* 54.6* 58.4* 57.6*  MCV 110.1*   < > 110.8* 110.7* 107.7* 104.3* 103.0*  PLT 189   < > 182 206 192 177 167   < > = values in this interval not displayed.   Cardiac Enzymes: No results for input(s): CKTOTAL, CKMB, CKMBINDEX, TROPONINI in the last 168 hours. BNP: Invalid input(s): POCBNP CBG: No results for input(s): GLUCAP in the last 168 hours. D-Dimer No results for input(s): DDIMER in the last 72 hours. Hgb A1c No results for input(s): HGBA1C in the last 72 hours. Lipid Profile No results for input(s): CHOL, HDL, LDLCALC, TRIG, CHOLHDL, LDLDIRECT in the last 72 hours. Thyroid function studies No results for input(s): TSH, T4TOTAL, T3FREE, THYROIDAB in the last 72 hours.  Invalid input(s):  FREET3 Anemia work up No results for input(s): VITAMINB12, FOLATE, FERRITIN, TIBC, IRON, RETICCTPCT in the last 72 hours. Urinalysis No results found for: COLORURINE, APPEARANCEUR, LABSPEC, PHURINE, GLUCOSEU, HGBUR, BILIRUBINUR, KETONESUR, PROTEINUR, UROBILINOGEN, NITRITE, LEUKOCYTESUR Sepsis Labs Invalid input(s): PROCALCITONIN,  WBC,  LACTICIDVEN Microbiology Recent Results (from the past 240 hour(s))  SARS CORONAVIRUS 2 (TAT 6-24 HRS) Nasopharyngeal Nasopharyngeal Swab     Status: None   Collection Time: 08/17/19  1:28 PM   Specimen: Nasopharyngeal Swab  Result Value Ref Range Status   SARS Coronavirus 2 NEGATIVE NEGATIVE Final    Comment: (NOTE) SARS-CoV-2 target nucleic acids are NOT DETECTED. The SARS-CoV-2 RNA is generally detectable in upper and lower respiratory specimens during the acute phase of infection. Negative results do not preclude SARS-CoV-2 infection, do not rule out co-infections with other pathogens, and should not be used as the sole basis for treatment or other patient management decisions. Negative results must be combined with clinical observations, patient history, and epidemiological information. The expected result is Negative. Fact Sheet for Patients: HairSlick.no Fact Sheet for Healthcare Providers: quierodirigir.com This test is not yet approved or cleared by the [place] FDA and  has been authorized for detection and/or diagnosis of SARS-CoV-2 by FDA under an Emergency Use Authorization (EUA). This EUA will remain  in effect (meaning this test can be used) for the duration of the COVID-19 declaration under Section 56 4(b)(1) of the Act, 21 U.S.C. section 360bbb-3(b)(1), unless the authorization is terminated or revoked sooner. Performed at [facility], [address], [place], [place] [address]   Culture, sputum-assessment     Status: None   Collection Time: 08/17/19  2:43 PM    Specimen: Expectorated Sputum  Result Value Ref Range Status   Specimen Description EXPECTORATED SPUTUM  Final   Special Requests NONE  Final   Sputum evaluation   Final    THIS SPECIMEN IS ACCEPTABLE FOR SPUTUM CULTURE Performed at [facility], [address], [place], [place] [address]    Report Status 08/17/2019 FINAL  Final  Culture, respiratory     Status: None   Collection Time: 08/17/19  2:43 PM  Result Value Ref Range Status   Specimen Description   Final    EXPECTORATED SPUTUM Performed at [facility], [address] [place], [place] [address]    Special Requests   Final    NONE Reflexed from [id number] Performed at [facility], [address] [place], [place] [address]    Gram Stain   Final    MODERATE WBC PRESENT, PREDOMINANTLY PMN MODERATE GRAM POSITIVE RODS FEW GRAM POSITIVE COCCI RARE GRAM NEGATIVE RODS    Culture   Final    Consistent with normal respiratory flora. Performed at [facility], [address], [place], [place] [address]    Report Status 08/19/2019 FINAL  Final  Respiratory Panel by PCR     Status: None   Collection Time: 08/19/19  6:45 PM   Specimen: Nasopharyngeal Swab; Respiratory  Result Value Ref Range Status   Adenovirus NOT DETECTED NOT DETECTED Final   Coronavirus 229E NOT DETECTED NOT DETECTED Final    Comment: (NOTE) The Coronavirus on the Respiratory Panel, DOES NOT test for the novel  Coronavirus (2019 nCoV)    Coronavirus HKU1 NOT DETECTED NOT DETECTED Final   Coronavirus NL63 NOT DETECTED NOT DETECTED Final   Coronavirus OC43 NOT DETECTED NOT DETECTED Final   Metapneumovirus NOT DETECTED NOT DETECTED Final   Rhinovirus / Enterovirus NOT DETECTED NOT DETECTED Final   Influenza A NOT DETECTED NOT DETECTED Final   Influenza B NOT DETECTED NOT DETECTED Final   Parainfluenza Virus 1 NOT DETECTED NOT DETECTED Final   Parainfluenza Virus 2 NOT DETECTED NOT DETECTED Final    Parainfluenza Virus 3 NOT DETECTED NOT DETECTED Final   Parainfluenza Virus 4 NOT DETECTED NOT DETECTED Final   Respiratory Syncytial Virus NOT DETECTED NOT DETECTED Final   Bordetella pertussis NOT DETECTED NOT DETECTED Final   Chlamydophila pneumoniae NOT DETECTED NOT DETECTED Final   Mycoplasma pneumoniae NOT DETECTED NOT DETECTED Final    Comment: Performed at [facility], [address] [place], [place] [address]  MRSA PCR Screening     Status: None   Collection Time: 08/20/19  9:20 PM   Specimen: Nasal Mucosa; Nasopharyngeal  Result Value Ref Range Status   MRSA by PCR NEGATIVE NEGATIVE Final    Comment:        The GeneXpert MRSA Assay (FDA approved for NASAL specimens only), is one component of a comprehensive MRSA colonization surveillance program. It is not intended to diagnose MRSA infection nor to guide or monitor treatment for MRSA infections. Performed at [facility], [address], [place], [place] [address]      Time coordinating discharge: 40 minutes  SIGNED:   Alba Cory, MD  [facility]

## 2019-08-23 NOTE — Telephone Encounter (Signed)
ATC pt, no answer. Left message for pt to call back.  He needs a consult appt from my understanding and don't think AO can just order a sleep test without the pt being seen.   AO please advise.

## 2019-08-23 NOTE — Telephone Encounter (Signed)
Pt has been scheduled for a sleep consult with Dr. Wynona Neat for 4/29 at 1:30pm.  Pt currently still admitted.  Will forward to Dr. Chestine Spore and Dr. Wynona Neat as [place]ain Childes.

## 2019-08-25 LAB — ALLERGENS W/TOTAL IGE AREA 2
Alternaria Alternata IgE: 0.12 kU/L — AB
Aspergillus Fumigatus IgE: 0.14 kU/L — AB
Bermuda Grass IgE: <0.1 kU/L
Cat Dander IgE: <0.1 kU/L
Cedar, Mountain IgE: <0.1 kU/L
Cladosporium Herbarum IgE: <0.1 kU/L
Cockroach, German IgE: <0.1 kU/L
Common Silver Birch IgE: <0.1 kU/L
Cottonwood IgE: <0.1 kU/L
D Farinae IgE: 8.15 kU/L — AB
D Pteronyssinus IgE: 9.24 kU/L — AB
Dog Dander IgE: <0.1 kU/L
Elm, American IgE: <0.1 kU/L
IgE (Immunoglobulin E), Serum: 310 IU/mL (ref 6–495)
Johnson Grass IgE: <0.1 kU/L
Maple/Box Elder IgE: <0.1 kU/L
Mouse Urine IgE: <0.1 kU/L
Oak, White IgE: <0.1 kU/L
Pecan, Hickory IgE: <0.1 kU/L
Penicillium Chrysogen IgE: 0.12 kU/L — AB
Pigweed, Rough IgE: <0.1 kU/L
Ragweed, Short IgE: <0.1 kU/L
Sheep Sorrel IgE Qn: <0.1 kU/L
Timothy Grass IgE: <0.1 kU/L
White Mulberry IgE: <0.1 kU/L

## 2019-08-25 NOTE — Telephone Encounter (Signed)
Aware. 

## 2019-09-13 ENCOUNTER — Other Ambulatory Visit: Payer: Self-pay

## 2019-09-13 ENCOUNTER — Encounter: Payer: Self-pay | Admitting: Pulmonary Disease

## 2019-09-13 ENCOUNTER — Ambulatory Visit (INDEPENDENT_AMBULATORY_CARE_PROVIDER_SITE_OTHER): Payer: Managed Care, Other (non HMO) | Admitting: Pulmonary Disease

## 2019-09-13 VITALS — BP 128/78 | HR 95 | Temp 98.0°F | Ht 67.0 in | Wt 259.2 lb

## 2019-09-13 DIAGNOSIS — J9612 Chronic respiratory failure with hypercapnia: Secondary | ICD-10-CM

## 2019-09-13 DIAGNOSIS — G4733 Obstructive sleep apnea (adult) (pediatric): Secondary | ICD-10-CM | POA: Diagnosis not present

## 2019-09-13 DIAGNOSIS — J9611 Chronic respiratory failure with hypoxia: Secondary | ICD-10-CM

## 2019-09-13 NOTE — Patient Instructions (Signed)
Hypercapnic respiratory failure Recent cor pulmonale  Order split-night study  Pulmonary function tests  I will see you back in 6 to 8 weeks  Discontinue Lasix use  Call with significant concerns  Continue aggressive weight loss measures

## 2019-09-13 NOTE — Progress Notes (Signed)
Joseph [name]    [id number]    12-10-1980  Primary Care Physician:Little, Lennette Bihari, MD  Referring Physician: No referring provider defined for this encounter.  Chief complaint:   Recent hospitalization for decompensated heart failure  HPI:  Patient was recently hospitalized for cor pulmonale, decompensated heart failure  Echocardiogram revealed normal left and right-sided functions Reformed smoker quit 10 years ago, was not diagnosed with any lung disease at the time  He has managed to lose about 30 pounds since discharge from the hospital  He was discharged on a trilogy ventilator This is not covered by his insurance  He has asthma, high blood pressure  He does snore, no witnessed apneas Usually goes to bed between 10 and 11, takes him about 30 minutes to fall asleep 2-3 awakenings Final awakening time 6:30 AM  Memory is off sometimes Occasional headaches Occasional dryness of his mouth No night sweats  Shortness of breath is improving   Outpatient Encounter Medications as of 09/13/2019  Medication Sig  . ADVAIR DISKUS 250-50 MCG/DOSE AEPB Inhale 1 puff into the lungs 2 (two) times daily.  Marland Kitchen albuterol (VENTOLIN HFA) 108 (90 Base) MCG/ACT inhaler Inhale 2 puffs into the lungs every 6 (six) hours as needed for wheezing or shortness of breath.  Marland Kitchen amLODipine (NORVASC) 10 MG tablet Take 10 mg by mouth daily.  Marland Kitchen aspirin EC 81 MG EC tablet Take 1 tablet (81 mg total) by mouth daily.  . furosemide (LASIX) 20 MG tablet Take 1 tablet (20 mg total) by mouth daily.  Marland Kitchen guaiFENesin (MUCINEX) 600 MG 12 hr tablet Take 600 mg by mouth 2 (two) times daily as needed for cough or to loosen phlegm.  Marland Kitchen ipratropium-albuterol (DUONEB) 0.5-2.5 (3) MG/3ML SOLN Take 3 mLs by nebulization every 6 (six) hours as needed.  . [DISCONTINUED] predniSONE (DELTASONE) 10 MG tablet Take 4 tablets for 3 days then 3 tablets for 3 days then stop.   No facility-administered encounter medications  on file as of 09/13/2019.    Allergies as of 09/13/2019  . (No Known Allergies)    Past Medical History:  Diagnosis Date  . Asthma   . Hypertension     History reviewed. No pertinent surgical history.  Family History  Problem Relation Age of Onset  . Asthma Father     Social History   Socioeconomic History  . Marital status: Single    Spouse name: Not on file  . Number of children: Not on file  . Years of education: Not on file  . Highest education level: Not on file  Occupational History  . Not on file  Tobacco Use  . Smoking status: Former Smoker    Packs/day: 1.00    Years: 8.00    Pack years: 8.00    Types: Cigarettes    Quit date: 05/26/2009    Years since quitting: 10.3  . Smokeless tobacco: Never Used  Substance and Sexual Activity  . Alcohol use: Not on file  . Drug use: Not on file  . Sexual activity: Not on file  Other Topics Concern  . Not on file  Social History Narrative  . Not on file   Social Determinants of Health   Financial Resource Strain:   . Difficulty of Paying Living Expenses:   Food Insecurity:   . Worried About Charity fundraiser in the Last Year:   . Arboriculturist in the Last Year:   Transportation Needs:   .  Lack of Transportation (Medical):   Marland Kitchen Lack of Transportation (Non-Medical):   Physical Activity:   . Days of Exercise per Week:   . Minutes of Exercise per Session:   Stress:   . Feeling of Stress :   Social Connections:   . Frequency of Communication with Friends and Family:   . Frequency of Social Gatherings with Friends and Family:   . Attends Religious Services:   . Active Member of Clubs or Organizations:   . Attends Banker Meetings:   Marland Kitchen Marital Status:   Intimate Partner Violence:   . Fear of Current or Ex-Partner:   . Emotionally Abused:   Marland Kitchen Physically Abused:   . Sexually Abused:     Review of Systems  Constitutional: Negative.   HENT: Negative.   Cardiovascular: Positive for leg  swelling.  Gastrointestinal: Negative.   Psychiatric/Behavioral: Positive for sleep disturbance.    Vitals:   09/13/19 1507  BP: 128/78  Pulse: 95  Temp: 98 F (36.7 C)  SpO2: 95%     Physical Exam  Constitutional: He appears well-developed and well-nourished.  HENT:  Head: Normocephalic and atraumatic.  Eyes: Pupils are equal, round, and reactive to light. Conjunctivae are normal. Right eye exhibits no discharge. Left eye exhibits no discharge.  Neck: No tracheal deviation present. No thyromegaly present.  Cardiovascular: Normal rate and regular rhythm.  Pulmonary/Chest: Effort normal and breath sounds normal. No respiratory distress. He has no wheezes. He has no rales. He exhibits no tenderness.  Musculoskeletal:        General: No edema.     Cervical back: Normal range of motion and neck supple.  Neurological: He is alert.  Skin: Skin is warm and dry. No erythema.  Psychiatric: He has a normal mood and affect.   Results of the Epworth flowsheet 09/13/2019  Sitting and reading 0  Watching TV 1  Sitting, inactive in a public place (e.g. a theatre or a meeting) 0  As a passenger in a car for an hour without a break 1  Lying down to rest in the afternoon when circumstances permit 0  Sitting and talking to someone 0  Sitting quietly after a lunch without alcohol 1  In a car, while stopped for a few minutes in traffic 0  Total score 3   Data Reviewed: Hospital records reviewed Recent echocardiogram reviewed Recent ABG showing hypercapnic respiratory failure  Assessment:  Hypercapnic respiratory failure  History of cor pulmonale  High probability of significant obstructive lung disease, possibility of central sleep apnea  Pathophysiology of sleep disordered breathing discussed with patient Treatment options for sleep disordered breathing discussed with the patient Plan/Recommendations: We will schedule the patient for split-night study  May require CPAP or BiPAP  or BiPAP ST for management of sleep apnea  Encouraged to continue weight loss efforts  I will see him back in 2 months  We will obtain a PFT  May discontinue use of Lasix  Virl Diamond MD [facility] 09/13/2019, 3:39 PM  CC: No ref. provider found

## 2019-09-16 ENCOUNTER — Ambulatory Visit: Payer: Managed Care, Other (non HMO) | Attending: Internal Medicine

## 2019-09-16 DIAGNOSIS — Z23 Encounter for immunization: Secondary | ICD-10-CM

## 2019-09-16 NOTE — Progress Notes (Signed)
   Covid-19 Vaccination Clinic  Name:  Joseph [name]    MRN: [id number] DOB: 06-02-1980  09/16/2019  [name] was observed post Covid-19 immunization for 15 minutes without incident. He was provided with Vaccine Information Sheet and instruction to access the V-Safe system.   [name] was instructed to call 911 with any severe reactions post vaccine: Marland Kitchen Difficulty breathing  . Swelling of face and throat  . A fast heartbeat  . A bad rash all over body  . Dizziness and weakness   Immunizations Administered    Name Date Dose VIS Date Route   Pfizer COVID-19 Vaccine 09/16/2019  8:15 AM 0.3 mL 07/20/2018 Intramuscular   Manufacturer: [employer], [employer]   Lot: [id number]   NDC: [address]-6

## 2019-09-22 ENCOUNTER — Institutional Professional Consult (permissible substitution): Payer: Managed Care, Other (non HMO) | Admitting: Pulmonary Disease

## 2019-09-28 ENCOUNTER — Other Ambulatory Visit (HOSPITAL_COMMUNITY)
Admission: RE | Admit: 2019-09-28 | Discharge: 2019-09-28 | Disposition: A | Discharge status: Home / Self Care | Payer: Managed Care, Other (non HMO) | Source: Ambulatory Visit | Attending: Pulmonary Disease | Admitting: Pulmonary Disease

## 2019-09-28 DIAGNOSIS — Z20822 Contact with and (suspected) exposure to covid-19: Secondary | ICD-10-CM | POA: Diagnosis not present

## 2019-09-28 DIAGNOSIS — Z01812 Encounter for preprocedural laboratory examination: Secondary | ICD-10-CM | POA: Diagnosis present

## 2019-09-28 LAB — SARS CORONAVIRUS 2 (TAT 6-24 HRS): SARS Coronavirus 2: NEGATIVE

## 2019-09-30 ENCOUNTER — Ambulatory Visit (HOSPITAL_BASED_OUTPATIENT_CLINIC_OR_DEPARTMENT_OTHER): Payer: Managed Care, Other (non HMO) | Attending: Pulmonary Disease | Admitting: Pulmonary Disease

## 2019-09-30 ENCOUNTER — Other Ambulatory Visit: Payer: Self-pay

## 2019-09-30 DIAGNOSIS — G4761 Periodic limb movement disorder: Secondary | ICD-10-CM | POA: Diagnosis not present

## 2019-09-30 DIAGNOSIS — R0902 Hypoxemia: Secondary | ICD-10-CM | POA: Diagnosis not present

## 2019-09-30 DIAGNOSIS — G4733 Obstructive sleep apnea (adult) (pediatric): Secondary | ICD-10-CM | POA: Insufficient documentation

## 2019-10-03 ENCOUNTER — Other Ambulatory Visit: Payer: Self-pay

## 2019-10-04 ENCOUNTER — Telehealth: Payer: Self-pay | Admitting: Pulmonary Disease

## 2019-10-04 DIAGNOSIS — G4733 Obstructive sleep apnea (adult) (pediatric): Secondary | ICD-10-CM

## 2019-10-04 NOTE — Telephone Encounter (Signed)
Call patient  Sleep study result  Date of study: 09/30/2019  Impression: Moderate obstructive sleep apnea  Recommendation: Therapeutic CPAP titration study Weight loss efforts Management of periodic limb movement following adequate titration to CPAP

## 2019-10-04 NOTE — Telephone Encounter (Signed)
Advised pt of results. Pt understood and nothing further is needed.   CPAP titration ordered.  

## 2019-10-04 NOTE — Telephone Encounter (Signed)
LMTCB for sleep study results 

## 2019-10-04 NOTE — Procedures (Signed)
POLYSOMNOGRAPHY  Last, First: Briston, Lax MRN: [id number] Gender: Male Age (years): 39 Weight (lbs): 260 DOB: 28-Jul-1980 BMI: 41 Primary Care: No PCP Epworth Score: 9 Referring: Tomma Lightning MD Technician: Rolene Arbour Interpreting: Tomma Lightning MD Study Type: NPSG Ordered Study Type: Split Night CPAP Study date: 09/30/2019 Location: [place] CLINICAL INFORMATION Joseph [name] is a 39 year old Male and was referred to the sleep center for evaluation of G47.30 Sleep Apnea, Unspecified (780.57). Indications include OSA.  MEDICATIONS Patient self administered medications include: TYLENOL PM. Medications administered during study include Sleep medicine administered - Tylenol PM at 10:00:09 AM  SLEEP STUDY TECHNIQUE A multi-channel overnight Polysomnography study was performed. The channels recorded and monitored were central and occipital EEG, electrooculogram (EOG), submentalis EMG (chin), nasal and oral airflow, thoracic and abdominal wall motion, anterior tibialis EMG, snore microphone, electrocardiogram, and a pulse oximetry. TECHNICIAN COMMENTS Comments added by Technician: Patient had more than two awakenings to use the bathroom. Patient was restless all through the night. Patient had difficulty initiating sleep. Comments added by Scorer: N/A SLEEP ARCHITECTURE The study was initiated at 10:22:08 PM and terminated at 4:43:43 AM. The total recorded time was 381.6 minutes. EEG confirmed total sleep time was 265 minutes yielding a sleep efficiency of 69.4%%. Sleep onset after lights out was 7.8 minutes with a REM latency of 262.5 minutes. The patient spent 3.2%% of the night in stage N1 sleep, 85.5%% in stage N2 sleep, 1.5%% in stage N3 and 9.8% in REM. Wake after sleep onset (WASO) was 108.8 minutes. The Arousal Index was 28.1/hour. RESPIRATORY PARAMETERS There were a total of 94 respiratory disturbances out of which 3 were apneas ( 1 obstructive, 0 mixed, 2  central) and 91 hypopneas. The apnea/hypopnea index (AHI) was 21.3 events/hour. The central sleep apnea index was 0.5 events/hour. The REM AHI was 36.9 events/hour and NREM AHI was 19.6 events/hour. The supine AHI was 1.3 events/hour and the non supine AHI was 25.5 supine during 17.36% of sleep. Respiratory disturbances were associated with oxygen desaturation down to a nadir of 83.0% during sleep. The mean oxygen saturation during the study was 89.0%. The cumulative time under 88% oxygen saturation was 5.5 minutes.  LEG MOVEMENT DATA The total leg movements were 164 with a resulting leg movement index of 37.1/hr .Associated arousal with leg movement index was 5.0/hr.  CARDIAC DATA The underlying cardiac rhythm was most consistent with sinus rhythm. Mean heart rate during sleep was 79.9 bpm. Additional rhythm abnormalities include None.   IMPRESSIONS - Moderate Obstructive Sleep apnea(OSA) - EKG showed no cardiac abnormalities. - Moderate Oxygen Desaturation - The patient snored with loud snoring volume. - Significant periodic leg movements(PLMs) during sleep. Associated arousals were significant with an arousal index of 5.0 /hour.   DIAGNOSIS - Obstructive Sleep Apnea (327.23 [G47.33 ICD-10]) - Periodic Limb Movement During Sleep (327.51 [G47.61 ICD-10]) - Nocturnal Hypoxemia (327.26 [G47.36 ICD-10])   RECOMMENDATIONS - Therapeutic CPAP titration to determine optimal pressure required to alleviate sleep disordered breathing. - Mirapex, Requip, or Sinemet for treatment of Periodic Leg Movements of Sleep. - Avoid alcohol, sedatives and other CNS depressants that may worsen sleep apnea and disrupt normal sleep architecture. - Sleep hygiene should be reviewed to assess factors that may improve sleep quality. - Weight management and regular exercise should be initiated or continued.  [Electronically signed] 10/04/2019 06:20 AM  Virl Diamond MD NPI: [id number]

## 2019-10-10 ENCOUNTER — Ambulatory Visit: Payer: Managed Care, Other (non HMO)

## 2019-10-12 ENCOUNTER — Telehealth: Payer: Self-pay | Admitting: Pulmonary Disease

## 2019-10-12 NOTE — Telephone Encounter (Signed)
LMTCB x 1 

## 2019-10-12 NOTE — Telephone Encounter (Signed)
Spoke with the pt  He states that he had PSG done 2 wks ago and slept horrible and this is why study has to be done again  He is asking if we could call him in a sleep aid to help him get some rest the night of his study  If so, he uses the [facility] on [address]  Please advise, thanks

## 2019-10-12 NOTE — Telephone Encounter (Signed)
Patient is returning phone call. Patient phone number is [phone].

## 2019-10-12 NOTE — Telephone Encounter (Signed)
LMTCB again

## 2019-10-14 ENCOUNTER — Other Ambulatory Visit (HOSPITAL_COMMUNITY)
Admission: RE | Admit: 2019-10-14 | Discharge: 2019-10-14 | Disposition: A | Discharge status: Home / Self Care | Payer: Managed Care, Other (non HMO) | Source: Ambulatory Visit | Attending: Pulmonary Disease | Admitting: Pulmonary Disease

## 2019-10-14 DIAGNOSIS — Z01812 Encounter for preprocedural laboratory examination: Secondary | ICD-10-CM | POA: Insufficient documentation

## 2019-10-14 DIAGNOSIS — Z20822 Contact with and (suspected) exposure to covid-19: Secondary | ICD-10-CM | POA: Insufficient documentation

## 2019-10-14 LAB — SARS CORONAVIRUS 2 (TAT 6-24 HRS): SARS Coronavirus 2: NEGATIVE

## 2019-10-15 ENCOUNTER — Other Ambulatory Visit (HOSPITAL_COMMUNITY): Payer: Managed Care, Other (non HMO)

## 2019-10-17 ENCOUNTER — Other Ambulatory Visit: Payer: Self-pay | Admitting: Pulmonary Disease

## 2019-10-17 MED ORDER — ESZOPICLONE 2 MG PO TABS: 2.0000 mg | ORAL_TABLET | Freq: Every evening | ORAL | 1 refills | Status: DC | PRN

## 2019-10-17 NOTE — Telephone Encounter (Signed)
Spoke with the pt and notified rx sent

## 2019-10-17 NOTE — Telephone Encounter (Signed)
Pt called back about this. Pt's sleep study is tomorrow and needs aid by today.

## 2019-10-17 NOTE — Telephone Encounter (Signed)
Called and spoke with pt. Pt stated he is scheduled to have sleep study tomorrow 5/25 and is requesting a sleep med to be able to take so he can sleep during the study.  Dr. Val Eagle, please advise.

## 2019-10-17 NOTE — Progress Notes (Signed)
Informed patient about prescription for [facility] sent in

## 2019-10-17 NOTE — Telephone Encounter (Signed)
Will send in [place]

## 2019-10-18 ENCOUNTER — Telehealth: Payer: Self-pay | Admitting: Pulmonary Disease

## 2019-10-18 ENCOUNTER — Other Ambulatory Visit: Payer: Self-pay

## 2019-10-18 ENCOUNTER — Ambulatory Visit (HOSPITAL_BASED_OUTPATIENT_CLINIC_OR_DEPARTMENT_OTHER): Payer: Managed Care, Other (non HMO) | Attending: Pulmonary Disease | Admitting: Pulmonary Disease

## 2019-10-18 DIAGNOSIS — I10 Essential (primary) hypertension: Secondary | ICD-10-CM | POA: Insufficient documentation

## 2019-10-18 DIAGNOSIS — E669 Obesity, unspecified: Secondary | ICD-10-CM | POA: Insufficient documentation

## 2019-10-18 DIAGNOSIS — Z6841 Body Mass Index (BMI) 40.0 and over, adult: Secondary | ICD-10-CM | POA: Insufficient documentation

## 2019-10-18 DIAGNOSIS — G4700 Insomnia, unspecified: Secondary | ICD-10-CM | POA: Insufficient documentation

## 2019-10-18 DIAGNOSIS — Z79899 Other long term (current) drug therapy: Secondary | ICD-10-CM | POA: Diagnosis not present

## 2019-10-18 DIAGNOSIS — G4733 Obstructive sleep apnea (adult) (pediatric): Secondary | ICD-10-CM | POA: Insufficient documentation

## 2019-10-18 MED ORDER — ESZOPICLONE 2 MG PO TABS: 2.0000 mg | ORAL_TABLET | Freq: Every evening | ORAL | 1 refills | Status: DC | PRN

## 2019-10-18 NOTE — Telephone Encounter (Signed)
Spoke with pt. He is needing a refill on Lunesta. This was sent in yesterday by our office but was sent to the wrong pharmacy. I have called in the prescription to the correct pharmacy. Nothing further was needed.

## 2019-10-19 ENCOUNTER — Telehealth: Payer: Self-pay | Admitting: Pulmonary Disease

## 2019-10-19 NOTE — Telephone Encounter (Signed)
Spoke with [place] from [place]. She stated that the patient is at risk of losing his Trilogy vent since the insurance keeps denying the claim. She is aware that he had a cpap titration yesterday but wanted to know if AO would help the patient to keep his machine.   She stated that there was 2 ways to help. We can place an order for the Trilogy and document the reasoning why he is needing it. Or we can call and schedule a peer to peer at [phone] to actually talk with one of [employer]'s doctors to justify the Trilogy vent. If the 800 number doesn't work, Judeth Cornfield stated it was ok to call her back directly.   AO, please advise. Thanks!

## 2019-10-20 ENCOUNTER — Telehealth: Payer: Self-pay | Admitting: Pulmonary Disease

## 2019-10-20 DIAGNOSIS — G4733 Obstructive sleep apnea (adult) (pediatric): Secondary | ICD-10-CM

## 2019-10-20 NOTE — Telephone Encounter (Signed)
Call patient  Sleep study result  Date of study: 10/18/2019  Impression: Obstructive sleep apnea adequately treated with CPAP therapy  Recommendation:  - Trial of CPAP therapy on 11 cm H2O with a Medium size Fisher&Paykel Full Face Mask Simplus mask and heated humidification.  -Follow-up in office 4 to 6 weeks after CPAP set up.       Note: Patient currently on trilogy ventilator

## 2019-10-20 NOTE — Procedures (Signed)
POLYSOMNOGRAPHY  Last, First: Joseph, [name] MRN: [id number] Gender: Male Age (years): 39 Weight (lbs): 260 DOB: 23-Sep-1980 BMI: 41 Primary Care: No PCP Epworth Score: 9 Referring: Tomma Lightning MD Technician: Shelah Lewandowsky Interpreting: Tomma Lightning MD Study Type: CPAP Ordered Study Type: CPAP Study date: 10/18/2019 Location: [place] CLINICAL INFORMATION Joseph [name] is a 39 year old Male and was referred to the sleep center for evaluation of CPAP efficacy, OSA established on polysomnogram. Indications include Established OSA, Fatigue, Hypertension, Insomnia, Morning Headaches, Obesity, OSA, Restless Sleep, Snoring.   Most recent polysomnogram dated 09/30/2019 revealed an AHI of 21.3/h and RDI of 29.2/h. MEDICATIONS Patient self administered medications include: LUNESTA. Medications administered during study include No sleep medicine administered.  SLEEP STUDY TECHNIQUE The patient underwent an attended overnight polysomnography titration to assess the effects of CPAP therapy. The following variables were monitored: EEG(C4-A1, C3-A2, O1-A2, O2-A1), EOG, submental and leg EMG, ECG, oxyhemoglobin saturation by pulse oximetry, thoracic and abdominal respiratory effort belts, nasal/oral airflow by pressure sensor, body position sensor and snoring sensor. CPAP pressure was titrated to eliminate apneas, hypopneas and oxygen desaturation. Hypopneas were scored per AASM definition IB (4% desaturation)  TECHNICIAN COMMENTS Comments added by Technician: NONE Comments added by Scorer: N/A SLEEP ARCHITECTURE The study was initiated at 10:01:15 PM and terminated at 5:10:17 AM. Total recorded time was 429 minutes. EEG confirmed total sleep time was 396.5 minutes yielding a sleep efficiency of 92.4%%. Sleep onset after lights out was 5.9 minutes with a REM latency of 236.0 minutes. The patient spent 9.2%% of the night in stage N1 sleep, 82.8%% in stage N2 sleep, 0.0%% in stage N3  and 7.9% in REM. The Arousal Index was 12.7/hour. RESPIRATORY PARAMETERS The overall AHI was 2.6 per hour, and the RDI was 5.6 events/hour with a central apnea index of 0.0per hour. The most appropriate setting of CPAP was 11 cm H2O. At this setting, the sleep efficiency was 84% and the patient was supine for 0%. The AHI was 1.2 events per hour, and the RDI was 2.4 events/hour (with 0.0 central events) and the arousal index was 8.4 per hour.The oxygen nadir was 86.0% during sleep.    The cumulative time under 88% oxygen saturation was 5.5 minutes  LEG MOVEMENT DATA The total leg movements were 518 with a resulting leg movement index of 78.4/hr. Associated arousal with leg movement index was 0.8/hr. CARDIAC DATA The underlying cardiac rhythm was most consistent with sinus rhythm. Mean heart rate during sleep was 70.7 bpm. Additional rhythm abnormalities include None.   IMPRESSIONS - EKG showed no cardiac abnormalities. - The patient snored with moderate snoring volume. - no significant Oxygen Desaturation at optimal pressure. - No Significant Obstructive Sleep apnea(OSA) Optimal pressure attained. - No Significant Central Sleep Apnea (CSA) - No Significant Upper Airway Resistance Syndrome(UARS). - No significant periodic leg movements(PLMs) during sleep, no significant associated arousals. - Normal sleep efficiency, normal primary sleep latency, long REM sleep latency and no slow wave latency.   DIAGNOSIS - Obstructive Sleep Apnea (327.23 [G47.33 ICD-10])   RECOMMENDATIONS - Trial of CPAP therapy on 11 cm H2O with a Medium size Fisher&Paykel Full Face Mask Simplus mask and heated humidification. - Avoid alcohol, sedatives and other CNS depressants that may worsen sleep apnea and disrupt normal sleep architecture. - Sleep hygiene should be reviewed to assess factors that may improve sleep quality. - Weight management and regular exercise should be initiated or continued. - Return to  Sleep Center for re-evaluation after 4  weeks of therapy  [Electronically signed] 10/20/2019 05:25 AM  Sherrilyn Rist MD NPI: [id number]

## 2019-10-21 ENCOUNTER — Other Ambulatory Visit (HOSPITAL_COMMUNITY): Payer: Managed Care, Other (non HMO)

## 2019-10-25 NOTE — Telephone Encounter (Signed)
Orders placed to DME for new CPAP and supplies.

## 2019-10-28 ENCOUNTER — Ambulatory Visit: Payer: Managed Care, Other (non HMO) | Admitting: Adult Health

## 2019-10-29 ENCOUNTER — Other Ambulatory Visit (HOSPITAL_COMMUNITY): Payer: Managed Care, Other (non HMO)

## 2019-11-01 ENCOUNTER — Ambulatory Visit: Payer: Managed Care, Other (non HMO) | Admitting: Adult Health

## 2019-11-01 NOTE — Telephone Encounter (Signed)
Pt was put on a CPAP instead. Will close this encounter.

## 2019-12-09 ENCOUNTER — Inpatient Hospital Stay (HOSPITAL_COMMUNITY): Admission: RE | Admit: 2019-12-09 | Payer: Managed Care, Other (non HMO) | Source: Ambulatory Visit

## 2019-12-13 ENCOUNTER — Ambulatory Visit: Payer: Managed Care, Other (non HMO) | Admitting: Adult Health

## 2020-01-04 ENCOUNTER — Ambulatory Visit: Payer: Managed Care, Other (non HMO) | Admitting: Adult Health

## 2020-02-14 ENCOUNTER — Other Ambulatory Visit: Payer: Self-pay

## 2020-02-14 ENCOUNTER — Encounter: Payer: Self-pay | Admitting: Adult Health

## 2020-02-14 ENCOUNTER — Ambulatory Visit (INDEPENDENT_AMBULATORY_CARE_PROVIDER_SITE_OTHER): Payer: Managed Care, Other (non HMO) | Admitting: Pulmonary Disease

## 2020-02-14 ENCOUNTER — Ambulatory Visit (INDEPENDENT_AMBULATORY_CARE_PROVIDER_SITE_OTHER): Payer: Managed Care, Other (non HMO) | Admitting: Adult Health

## 2020-02-14 VITALS — BP 124/68 | HR 96 | Temp 97.6°F | Ht 67.0 in | Wt 281.0 lb

## 2020-02-14 DIAGNOSIS — G4733 Obstructive sleep apnea (adult) (pediatric): Secondary | ICD-10-CM | POA: Diagnosis not present

## 2020-02-14 DIAGNOSIS — J4550 Severe persistent asthma, uncomplicated: Secondary | ICD-10-CM

## 2020-02-14 DIAGNOSIS — Z23 Encounter for immunization: Secondary | ICD-10-CM

## 2020-02-14 DIAGNOSIS — J9611 Chronic respiratory failure with hypoxia: Secondary | ICD-10-CM | POA: Diagnosis not present

## 2020-02-14 DIAGNOSIS — J9612 Chronic respiratory failure with hypercapnia: Secondary | ICD-10-CM | POA: Diagnosis not present

## 2020-02-14 DIAGNOSIS — J45909 Unspecified asthma, uncomplicated: Secondary | ICD-10-CM | POA: Diagnosis not present

## 2020-02-14 DIAGNOSIS — Z6841 Body Mass Index (BMI) 40.0 and over, adult: Secondary | ICD-10-CM

## 2020-02-14 MED ORDER — BREZTRI AEROSPHERE 160-9-4.8 MCG/ACT IN AERO: 2.0000 | INHALATION_SPRAY | Freq: Two times a day (BID) | RESPIRATORY_TRACT | 0 refills | Status: DC

## 2020-02-14 MED ORDER — MONTELUKAST SODIUM 10 MG PO TABS: 10.0000 mg | ORAL_TABLET | Freq: Every day | ORAL | 11 refills | Status: DC

## 2020-02-14 MED ORDER — BREZTRI AEROSPHERE 160-9-4.8 MCG/ACT IN AERO: 2.0000 | INHALATION_SPRAY | Freq: Two times a day (BID) | RESPIRATORY_TRACT | 5 refills | Status: DC

## 2020-02-14 NOTE — Progress Notes (Signed)
Full PFT performed today. °

## 2020-02-14 NOTE — Assessment & Plan Note (Signed)
Severe persistent asthma versus COPD PFTs show severe chronic obstruction.  Patient does have some smoking history given his age and severity . Will check alpha-1 antitrypsin level. Also suspect he has an allergic component as his IgE was elevated.  We will check a CBC with differential in 2 weeks as he was recently on prednisone. Patient is encouraged on trigger prevention.  We will begin Singulair and Zyrtec.  Change his Advair over to triple therapy- Have close follow-up.  If unresponsive and continues to have frequent flares may need to consider possible Biologics with Dupixent, or Fasenra if lab work supports  Plan  Patient Instructions  Add Singulair 10mg  At bedtime   Add Zyrtec 10mg  daily  Change Advair to BREZTRI 2 puffs Twice daily  , rinse after use.  Albuterol inhaler As needed  Wheezing .  Activity as tolerated  Healthy weight loss.  Return for labs in 1-2 weeks .  Wear CPAP all night for at least 6hr  Work on weight loss  Do not drive if sleepy  Follow up with Dr. in 6-8 weeks and As needed   Please contact office for sooner follow up if symptoms do not improve or worsen or seek emergency care  Flu shot today

## 2020-02-14 NOTE — Assessment & Plan Note (Signed)
Moderate obstructive sleep apnea newly diagnosed with improved clinical benefit on CPAP.  Patient is encouraged on compliance. And increase daily usage  Plan  . Patient Instructions  Add Singulair 10mg  At bedtime   Add Zyrtec 10mg  daily  Change Advair to BREZTRI 2 puffs Twice daily  , rinse after use.  Albuterol inhaler As needed  Wheezing .  Activity as tolerated  Healthy weight loss.  Return for labs in 1-2 weeks .  Wear CPAP all night for at least 6hr  Work on weight loss  Do not drive if sleepy  Follow up with Dr. in 6-8 weeks and As needed   Please contact office for sooner follow up if symptoms do not improve or worsen or seek emergency care  Flu shot today

## 2020-02-14 NOTE — Assessment & Plan Note (Addendum)
Healthy weight loss  Plan  Patient Instructions  Add Singulair 10mg  At bedtime   Add Zyrtec 10mg  daily  Change Advair to BREZTRI 2 puffs Twice daily  , rinse after use.  Albuterol inhaler As needed  Wheezing .  Activity as tolerated  Healthy weight loss.  Return for labs in 1-2 weeks .  Wear CPAP all night for at least 6hr  Work on weight loss  Do not drive if sleepy  Follow up with Dr. in 6-8 weeks and As needed   Please contact office for sooner follow up if symptoms do not improve or worsen or seek emergency care  Flu shot today

## 2020-02-14 NOTE — Progress Notes (Signed)
@Patient  ID: Joseph FreestoneJeffrey L Teague, male    DOB: 22-Jun-1980, 39 y.o.   MRN: [id number]  Chief Complaint  Patient presents with  . Follow-up    Asthma and obstructive sleep apnea    Referring provider: Catha GosselinLittle, Kevin, MD  HPI: 39 year old male former smoker seen for pulmonary consult during hospitalization March 2021 with acute on chronic hypercarbic and hypoxic respiratory failure with suspected obstructive sleep apnea and OHS Medical history significant for lifelong asthma  TEST/EVENTS :  Hospitalization March 2021 with acute on chronic hypercarbic and hypoxic respiratory failure required ICU admission with BiPAP, on admission hypercarbic with PCO2 at 72, CT chest showed cardiomegaly and a small pleural effusion, felt secondary to asthma exacerbation with decompensated cor pulmonale likely undiagnosed OSA and OHS. He was treated with IV antibiotics, steroids, BiPAP and diuresis for decompensated heart failure  2D echo August 17, 2019 EF 6065%, normal RV systolic function and RV size, CT chest August 19, 2019 minimal bilateral pleural effusions, dependent atelectasis in the right base, fatty infiltration of liver.   sleep study Sep 30, 2019 moderate obstructive sleep apnea, moderate oxygen desaturation and loud snoring. Significant periodic leg movements during sleep  02/14/2020 Follow up : OSA, chronic hypoxic and hypercarbic respiratory failure, asthma  patient returns for a 1217-month follow-up. Patient was seen initially during hospitalization March 2021 with acute on chronic hypercarbic and hypoxic respiratory failure. Felt to have undiagnosed sleep apnea and OHS. He was treated for an asthma exacerbation and decompensated diastolic heart failure. He improved with antibiotics BiPAP support steroids and diuresis. 2D echo showed normal EF and RV systolic function and RV size. A CT chest showed some minimal bilateral pleural effusions. Patient was seen in the office for post hospital follow-up. He was  set up for a sleep study which was completed on Sep 30, 2019. This showed moderate obstructive sleep apnea with desaturations and periodic leg movements. Patient was started on CPAP 11 cm H2O. Since last visit patient is feeling some better with less sleepiness. Does not take naps. Says he is getting used to CPAP .  CPAP download shows moderate compliance with 80% usage daily average usage at 4.5 hours. AHI 1.7. Patient is encouraged on compliance and increased usage.  Patient reports lifelong asthma. He is on Advair twice daily.  He is a former smoker. Says he always has wheezing , most of life.Has gotten worse over last several years.  Patient was set up for pulmonary function testing that shows severe airflow obstruction with an FEV1 at 47%, ratio 70, FVC 55%, significant bronchodilator response and a DLCO at 138%. Has had asthma since early childhood . Had a lot of asthma issues as child . As young adult more severe asthma symptoms. Managed by PCP . Has been on Advair for long time. Uses albuterol on occasion.  No known premature birth. Started smoking age 39 till 6328 1/2-1 PPD x 13 yr.  Work [employer] . 1 cat /dog . No basement . No hottub , no unusual hobbies.  No GERD or sinus issue . No rash/eczema.  Lab 07/2019 - allergy profile -+++dust , IgE increase 310.  Had asthma flare last week, started on Prednisone . Last dose yesterday . Feeling better w/ decreased wheezing .   Has been on steroids x 2 this year.       No Known Allergies  Immunization History  Administered Date(s) Administered  . Influenza,inj,Quad PF,6-35 Mos 02/24/2019  . PFIZER SARS-COV-2 Vaccination 09/16/2019, 10/07/2019    Past  Medical History:  Diagnosis Date  . Asthma   . Hypertension     Tobacco History: Social History   Tobacco Use  Smoking Status Former Smoker  . Packs/day: 1.00  . Years: 8.00  . Pack years: 8.00  . Types: Cigarettes  . Quit date: 05/26/2009  . Years since quitting: 10.7  Smokeless  Tobacco Never Used   Counseling given: Not Answered   Outpatient Medications Prior to Visit  Medication Sig Dispense Refill  . albuterol (VENTOLIN HFA) 108 (90 Base) MCG/ACT inhaler Inhale 2 puffs into the lungs every 6 (six) hours as needed for wheezing or shortness of breath.    Marland Kitchen amLODipine (NORVASC) 10 MG tablet Take 10 mg by mouth daily.    Marland Kitchen aspirin EC 81 MG EC tablet Take 1 tablet (81 mg total) by mouth daily. 30 tablet 0  . eszopiclone (LUNESTA) 2 MG TABS tablet Take 1 tablet (2 mg total) by mouth at bedtime as needed for sleep. Take immediately before bedtime 30 tablet 1  . furosemide (LASIX) 20 MG tablet Take 1 tablet (20 mg total) by mouth daily. 30 tablet 0  . guaiFENesin (MUCINEX) 600 MG 12 hr tablet Take 600 mg by mouth 2 (two) times daily as needed for cough or to loosen phlegm.    Marland Kitchen ipratropium-albuterol (DUONEB) 0.5-2.5 (3) MG/3ML SOLN Take 3 mLs by nebulization every 6 (six) hours as needed. 360 mL 0  . ADVAIR DISKUS 250-50 MCG/DOSE AEPB Inhale 1 puff into the lungs 2 (two) times daily.     No facility-administered medications prior to visit.     Review of Systems:   Constitutional:   No  weight loss, night sweats,  Fevers, chills, fatigue, or  lassitude.  HEENT:   No headaches,  Difficulty swallowing,  Tooth/dental problems, or  Sore throat,                No sneezing, itching, ear ache, nasal congestion, post nasal drip,   CV:  No chest pain,  Orthopnea, PND, swelling in lower extremities, anasarca, dizziness, palpitations, syncope.   GI  No heartburn, indigestion, abdominal pain, nausea, vomiting, diarrhea, change in bowel habits, loss of appetite, bloody stools.   Resp:  .  No chest wall deformity  Skin: no rash or lesions.  GU: no dysuria, change in color of urine, no urgency or frequency.  No flank pain, no hematuria   MS:  No joint pain or swelling.  No decreased range of motion.  No back pain.    Physical Exam  BP 124/68 (BP Location: Left Arm,  Cuff Size: Normal)   Pulse 96   Temp 97.6 F (36.4 C) (Temporal)   Ht 5\' 7"  (1.702 m)   Wt 281 lb (127.5 kg)   SpO2 95% Comment: RA  BMI 44.01 kg/m   GEN: A/Ox3; pleasant , NAD, well nourished     HEENT:  [place]/AT,   NOSE-clear, THROAT-clear, no lesions, no postnasal drip or exudate noted. Class 2-3 MP airway   NECK:  Supple w/ fair ROM; no JVD; normal carotid impulses w/o bruits; no thyromegaly or nodules palpated; no lymphadenopathy.  No stridor.  RESP  Faint exp wheezing on forced exp .  . no accessory muscle use, no dullness to percussion.  Speaks in full sentences.  CARD:  RRR, no m/r/g, no peripheral edema, pulses intact, no cyanosis or clubbing.  GI:   Soft & nt; nml bowel sounds; no organomegaly or masses detected.   Musco: Warm bil, no  deformities or joint swelling noted.   Neuro: alert, no focal deficits noted.    Skin: Warm, no lesions or rashes    Lab Results:  CBC  BNP  ProBNP No results found for: PROBNP  Imaging: No results found.    PFT Results Latest Ref Rng & Units 02/14/2020  FVC-Pre L 2.39  FVC-Predicted Pre % 49  FVC-Post L 2.66  FVC-Predicted Post % 55  Pre FEV1/FVC % % 66  Post FEV1/FCV % % 70  FEV1-Pre L 1.59  FEV1-Predicted Pre % 41  FEV1-Post L 1.85  DLCO uncorrected ml/min/mmHg 39.32  DLCO UNC% % 138  DLCO corrected ml/min/mmHg 39.32  DLCO COR %Predicted % 138  DLVA Predicted % 172  TLC L 6.77  TLC % Predicted % 107  RV % Predicted % 172    No results found for: NITRICOXIDE      Assessment & Plan:   OSA (obstructive sleep apnea) Moderate obstructive sleep apnea newly diagnosed with improved clinical benefit on CPAP.  Patient is encouraged on compliance. And increase daily usage  Plan  . Patient Instructions  Add Singulair 10mg  At bedtime   Add Zyrtec 10mg  daily  Change Advair to BREZTRI 2 puffs Twice daily  , rinse after use.  Albuterol inhaler As needed  Wheezing .  Activity as tolerated  Healthy weight loss.    Return for labs in 1-2 weeks .  Wear CPAP all night for at least 6hr  Work on weight loss  Do not drive if sleepy  Follow up with Dr. in 6-8 weeks and As needed   Please contact office for sooner follow up if symptoms do not improve or worsen or seek emergency care  Flu shot today       Obesity Healthy weight loss  Plan  Patient Instructions  Add Singulair 10mg  At bedtime   Add Zyrtec 10mg  daily  Change Advair to BREZTRI 2 puffs Twice daily  , rinse after use.  Albuterol inhaler As needed  Wheezing .  Activity as tolerated  Healthy weight loss.  Return for labs in 1-2 weeks .  Wear CPAP all night for at least 6hr  Work on weight loss  Do not drive if sleepy  Follow up with Dr. in 6-8 weeks and As needed   Please contact office for sooner follow up if symptoms do not improve or worsen or seek emergency care  Flu shot today       Severe persistent asthma Severe persistent asthma versus COPD PFTs show severe chronic obstruction.  Patient does have some smoking history given his age and severity . Will check alpha-1 antitrypsin level. Also suspect he has an allergic component as his IgE was elevated.  We will check a CBC with differential in 2 weeks as he was recently on prednisone. Patient is encouraged on trigger prevention.  We will begin Singulair and Zyrtec.  Change his Advair over to triple therapy- Have close follow-up.  If unresponsive and continues to have frequent flares may need to consider possible Biologics with Dupixent, or Fasenra if lab work supports  Plan  Patient Instructions  Add Singulair 10mg  At bedtime   Add Zyrtec 10mg  daily  Change Advair to BREZTRI 2 puffs Twice daily  , rinse after use.  Albuterol inhaler As needed  Wheezing .  Activity as tolerated  Healthy weight loss.  Return for labs in 1-2 weeks .  Wear CPAP all night for at least 6hr  Work on weight loss  Do not drive if sleepy  Follow up with Dr. Wynona Neat in 6-8 weeks  and As needed   Please contact office for sooner follow up if symptoms do not improve or worsen or seek emergency care  Flu shot today          Rubye Oaks, NP 02/14/2020

## 2020-02-14 NOTE — Patient Instructions (Addendum)
Add Singulair 10mg  At bedtime   Add Zyrtec 10mg  daily  Change Advair to BREZTRI 2 puffs Twice daily  , rinse after use.  Albuterol inhaler As needed  Wheezing .  Activity as tolerated  Healthy weight loss.  Return for labs in 1-2 weeks .  Wear CPAP all night for at least 6hr  Work on weight loss  Do not drive if sleepy  Follow up with Dr. in 6-8 weeks and As needed   Please contact office for sooner follow up if symptoms do not improve or worsen or seek emergency care  Flu shot today

## 2020-02-23 LAB — PULMONARY FUNCTION TEST
DL/VA % pred: 172 %
DL/VA: 8.18 ml/min/mmHg/L
DLCO cor % pred: 138 %
DLCO cor: 39.32 ml/min/mmHg
DLCO unc % pred: 138 %
DLCO unc: 39.32 ml/min/mmHg
FEF 25-75 Post: 1.6 L/sec
FEF 25-75 Pre: 0.92 L/sec
FEF2575-%Change-Post: 72 %
FEF2575-%Pred-Post: 42 %
FEF2575-%Pred-Pre: 24 %
FEV1-%Change-Post: 16 %
FEV1-%Pred-Post: 47 %
FEV1-%Pred-Pre: 41 %
FEV1-Post: 1.85 L
FEV1-Pre: 1.59 L
FEV1FVC-%Change-Post: 4 %
FEV1FVC-%Pred-Pre: 83 %
FEV6-%Change-Post: 11 %
FEV6-%Pred-Post: 56 %
FEV6-%Pred-Pre: 50 %
FEV6-Post: 2.66 L
FEV6-Pre: 2.39 L
FEV6FVC-%Pred-Post: 102 %
FEV6FVC-%Pred-Pre: 102 %
FVC-%Change-Post: 11 %
FVC-%Pred-Post: 55 %
FVC-%Pred-Pre: 49 %
FVC-Post: 2.66 L
FVC-Pre: 2.39 L
Post FEV1/FVC ratio: 70 %
Post FEV6/FVC ratio: 100 %
Pre FEV1/FVC ratio: 66 %
Pre FEV6/FVC Ratio: 100 %
RV % pred: 172 %
RV: 2.83 L
TLC % pred: 107 %
TLC: 6.77 L

## 2020-03-08 ENCOUNTER — Other Ambulatory Visit (INDEPENDENT_AMBULATORY_CARE_PROVIDER_SITE_OTHER): Payer: Managed Care, Other (non HMO)

## 2020-03-08 DIAGNOSIS — J45909 Unspecified asthma, uncomplicated: Secondary | ICD-10-CM | POA: Diagnosis not present

## 2020-03-08 LAB — CBC WITH DIFFERENTIAL/PLATELET
Basophils Absolute: 0.1 10*3/uL (ref 0.0–0.1)
Basophils Relative: 0.6 % (ref 0.0–3.0)
Eosinophils Absolute: 0.2 10*3/uL (ref 0.0–0.7)
Eosinophils Relative: 2.2 % (ref 0.0–5.0)
HCT: 45.9 % (ref 39.0–52.0)
Hemoglobin: 15.8 g/dL (ref 13.0–17.0)
Lymphocytes Relative: 25 % (ref 12.0–46.0)
Lymphs Abs: 2.4 10*3/uL (ref 0.7–4.0)
MCHC: 34.6 g/dL (ref 30.0–36.0)
MCV: 98.7 fl (ref 78.0–100.0)
Monocytes Absolute: 0.9 10*3/uL (ref 0.1–1.0)
Monocytes Relative: 9.5 % (ref 3.0–12.0)
Neutro Abs: 6 10*3/uL (ref 1.4–7.7)
Neutrophils Relative %: 62.7 % (ref 43.0–77.0)
Platelets: 203 10*3/uL (ref 150.0–400.0)
RBC: 4.65 Mil/uL (ref 4.22–5.81)
RDW: 13 % (ref 11.5–15.5)
WBC: 9.6 10*3/uL (ref 4.0–10.5)

## 2020-03-15 ENCOUNTER — Ambulatory Visit: Payer: Managed Care, Other (non HMO) | Admitting: Pulmonary Disease

## 2020-03-15 ENCOUNTER — Other Ambulatory Visit: Payer: Self-pay

## 2020-03-15 ENCOUNTER — Encounter: Payer: Self-pay | Admitting: Pulmonary Disease

## 2020-03-15 VITALS — BP 130/80 | HR 91 | Temp 97.1°F | Ht 67.0 in | Wt 290.1 lb

## 2020-03-15 DIAGNOSIS — Z9989 Dependence on other enabling machines and devices: Secondary | ICD-10-CM

## 2020-03-15 DIAGNOSIS — J4550 Severe persistent asthma, uncomplicated: Secondary | ICD-10-CM

## 2020-03-15 DIAGNOSIS — G4733 Obstructive sleep apnea (adult) (pediatric): Secondary | ICD-10-CM

## 2020-03-15 NOTE — Patient Instructions (Signed)
Obstructive sleep apnea -The CPAP is working excellently  Asthma -Continue with breztri -Call us with concerns  Follow-up in 3 months If you were to need a course of steroids or any significant exacerbation, let us know We will need some blood work before we can consider Biologics-I do not think he needs this at present  Aggressive weight loss measures as we discussed

## 2020-03-15 NOTE — Progress Notes (Signed)
Joseph [name]    [id number]    13-Mar-1981  Primary Care Physician:Little, Caryn Bee, MD  Referring Physician: Catha Gosselin, MD [address] [place],  [place] [address]  Chief complaint:   Recent exacerbation of asthma requiring steroids History of obstructive sleep apnea on CPAP In for follow-up today   HPI: Was recently switched to [facility] Has noticed some improvement with his symptoms with use of new inhaler Very compliant with CPAP  Weight has remained stable  Recently followed up with Ms Clent Ridges, NP -They had a discussion about use of Biologics if needed -With current improvement in symptoms with use of respiratory-we will continue to watch   Patient was recently hospitalized for cor pulmonale, decompensated heart failure  Echocardiogram revealed normal left and right-sided functions Reformed smoker quit 10 years ago, was not diagnosed with any lung disease at the time  Currently on CPAP of 11 Very compliant with CPAP  He has asthma, high blood pressure  He does snore, no witnessed apneas Usually goes to bed between 10 and 11, takes him about 30 minutes to fall asleep 2-3 awakenings Final awakening time 6:30 AM  Memory is off sometimes Occasional headaches Occasional dryness of his mouth No night sweats  Shortness of breath is improving   Outpatient Encounter Medications as of 03/15/2020  Medication Sig  . albuterol (VENTOLIN HFA) 108 (90 Base) MCG/ACT inhaler Inhale 2 puffs into the lungs every 6 (six) hours as needed for wheezing or shortness of breath.  Marland Kitchen amLODipine (NORVASC) 10 MG tablet Take 10 mg by mouth daily.  Marland Kitchen aspirin EC 81 MG EC tablet Take 1 tablet (81 mg total) by mouth daily.  . Budeson-Glycopyrrol-Formoterol (BREZTRI AEROSPHERE) 160-9-4.8 MCG/ACT AERO Inhale 2 puffs into the lungs 2 (two) times daily.  . Budeson-Glycopyrrol-Formoterol (BREZTRI AEROSPHERE) 160-9-4.8 MCG/ACT AERO Inhale 2 puffs into the lungs in the morning and  at bedtime.  . eszopiclone (LUNESTA) 2 MG TABS tablet Take 1 tablet (2 mg total) by mouth at bedtime as needed for sleep. Take immediately before bedtime  . furosemide (LASIX) 20 MG tablet Take 1 tablet (20 mg total) by mouth daily.  Marland Kitchen guaiFENesin (MUCINEX) 600 MG 12 hr tablet Take 600 mg by mouth 2 (two) times daily as needed for cough or to loosen phlegm.  Marland Kitchen ipratropium-albuterol (DUONEB) 0.5-2.5 (3) MG/3ML SOLN Take 3 mLs by nebulization every 6 (six) hours as needed.  . montelukast (SINGULAIR) 10 MG tablet Take 1 tablet (10 mg total) by mouth at bedtime.   No facility-administered encounter medications on file as of 03/15/2020.    Allergies as of 03/15/2020  . (No Known Allergies)    Past Medical History:  Diagnosis Date  . Asthma   . Hypertension     History reviewed. No pertinent surgical history.  Family History  Problem Relation Age of Onset  . Asthma Father     Social History   Socioeconomic History  . Marital status: Single    Spouse name: Not on file  . Number of children: Not on file  . Years of education: Not on file  . Highest education level: Not on file  Occupational History  . Not on file  Tobacco Use  . Smoking status: Former Smoker    Packs/day: 1.00    Years: 8.00    Pack years: 8.00    Types: Cigarettes    Quit date: 05/26/2009    Years since quitting: 10.8  . Smokeless tobacco:  Never Used  Substance and Sexual Activity  . Alcohol use: Not on file  . Drug use: Not on file  . Sexual activity: Not on file  Other Topics Concern  . Not on file  Social History Narrative  . Not on file   Social Determinants of Health   Financial Resource Strain:   . Difficulty of Paying Living Expenses: Not on file  Food Insecurity:   . Worried About Programme researcher, broadcasting/film/video in the Last Year: Not on file  . Ran Out of Food in the Last Year: Not on file  Transportation Needs:   . Lack of Transportation (Medical): Not on file  . Lack of Transportation  (Non-Medical): Not on file  Physical Activity:   . Days of Exercise per Week: Not on file  . Minutes of Exercise per Session: Not on file  Stress:   . Feeling of Stress : Not on file  Social Connections:   . Frequency of Communication with Friends and Family: Not on file  . Frequency of Social Gatherings with Friends and Family: Not on file  . Attends Religious Services: Not on file  . Active Member of Clubs or Organizations: Not on file  . Attends Banker Meetings: Not on file  . Marital Status: Not on file  Intimate Partner Violence:   . Fear of Current or Ex-Partner: Not on file  . Emotionally Abused: Not on file  . Physically Abused: Not on file  . Sexually Abused: Not on file    Review of Systems  Constitutional: Negative.   HENT: Negative.   Cardiovascular: Positive for leg swelling.  Gastrointestinal: Negative.   Psychiatric/Behavioral: Positive for sleep disturbance.    Vitals:   03/15/20 0857  BP: 130/80  Pulse: 91  Temp: (!) 97.1 F (36.2 C)  SpO2: 97%     Physical Exam Constitutional:      Appearance: He is well-developed.  HENT:     Head: Normocephalic and atraumatic.     Mouth/Throat:     Mouth: Mucous membranes are moist.  Eyes:     General:        Right eye: No discharge.        Left eye: No discharge.  Neck:     Thyroid: No thyromegaly.     Trachea: No tracheal deviation.  Cardiovascular:     Rate and Rhythm: Normal rate and regular rhythm.  Pulmonary:     Effort: Pulmonary effort is normal. No respiratory distress.     Breath sounds: Rhonchi present. No wheezing or rales.  Chest:     Chest wall: No tenderness.  Musculoskeletal:     Cervical back: No rigidity or tenderness.  Neurological:     Mental Status: He is alert.  Psychiatric:        Mood and Affect: Mood normal.    Results of the Epworth flowsheet 09/13/2019  Sitting and reading 0  Watching TV 1  Sitting, inactive in a public place (e.g. a theatre or a meeting)  0  As a passenger in a car for an hour without a break 1  Lying down to rest in the afternoon when circumstances permit 0  Sitting and talking to someone 0  Sitting quietly after a lunch without alcohol 1  In a car, while stopped for a few minutes in traffic 0  Total score 3   Data Reviewed: Hospital records reviewed Recent echocardiogram reviewed Recent ABG showing hypercapnic respiratory failure  Recent blood work reviewed  showing normal CBC eosinophil level of 2.2  PFT reveals severe obstruction with significant bronchodilator response  Compliance data reviewed showing 100% compliance with CPAP, CPAP set at 11 Occasional leaks Residual AHI of 0.6  Assessment:  Hypercapnic respiratory failure -Stable symptoms are present -General health seems to be improving  History of cor pulmonale  Moderate obstructive sleep apnea currently on CPAP therapy -CPAP of 11 -Excellent compliance  Severe persistent asthma -Continue Breztri   Plan/Recommendations:  Encouraged to continue CPAP use on a regular basis  Encouraged regarding aggressive weight loss efforts  Continue Zorita Pang MD [facility] 03/15/2020, 9:18 AM  CC: Catha Gosselin, MD

## 2020-03-20 LAB — ALPHA-1 ANTITRYPSIN PHENOTYPE: A-1 Antitrypsin, Ser: 137 mg/dL (ref 83–199)

## 2020-05-23 ENCOUNTER — Other Ambulatory Visit: Payer: Self-pay | Admitting: Pulmonary Disease

## 2020-05-24 NOTE — Telephone Encounter (Signed)
Dr. O, please advise if you are okay refilling med. 

## 2020-06-06 DIAGNOSIS — Z8616 Personal history of COVID-19: Secondary | ICD-10-CM

## 2020-06-06 HISTORY — DX: Personal history of COVID-19: Z86.16

## 2020-06-07 ENCOUNTER — Other Ambulatory Visit: Payer: Managed Care, Other (non HMO)

## 2020-06-28 ENCOUNTER — Ambulatory Visit: Payer: Managed Care, Other (non HMO) | Admitting: Adult Health

## 2020-07-10 ENCOUNTER — Telehealth: Payer: Self-pay

## 2020-07-10 ENCOUNTER — Ambulatory Visit: Payer: Managed Care, Other (non HMO) | Admitting: Adult Health

## 2020-07-10 NOTE — Telephone Encounter (Signed)
ATC x 1. Left voicemail for patient to bring SD card from CPAP machine for appointment with [facility] tomorrow.

## 2020-07-11 ENCOUNTER — Ambulatory Visit: Payer: Managed Care, Other (non HMO) | Admitting: Primary Care

## 2020-07-11 ENCOUNTER — Ambulatory Visit (INDEPENDENT_AMBULATORY_CARE_PROVIDER_SITE_OTHER): Payer: Managed Care, Other (non HMO)

## 2020-07-11 ENCOUNTER — Encounter: Payer: Self-pay | Admitting: Primary Care

## 2020-07-11 ENCOUNTER — Other Ambulatory Visit: Payer: Self-pay

## 2020-07-11 VITALS — BP 126/84 | HR 92 | Temp 98.0°F | Ht 67.0 in | Wt 295.0 lb

## 2020-07-11 DIAGNOSIS — G4733 Obstructive sleep apnea (adult) (pediatric): Secondary | ICD-10-CM | POA: Diagnosis not present

## 2020-07-11 DIAGNOSIS — J4550 Severe persistent asthma, uncomplicated: Secondary | ICD-10-CM

## 2020-07-11 DIAGNOSIS — U071 COVID-19: Secondary | ICD-10-CM

## 2020-07-11 MED ORDER — PREDNISONE 10 MG PO TABS: ORAL_TABLET | ORAL | 0 refills | Status: DC

## 2020-07-11 MED ORDER — BREZTRI AEROSPHERE 160-9-4.8 MCG/ACT IN AERO: 2.0000 | INHALATION_SPRAY | Freq: Two times a day (BID) | RESPIRATORY_TRACT | 11 refills | Status: DC

## 2020-07-11 NOTE — Patient Instructions (Addendum)
Asthma: Continue Breztri 2 puffs twice daily Use Albuterol rescue inhaler 2 puffs every 4-6 hours for breakthrough shortness of breath /wheezing  Sleep apnea:  Continue wearing CPAP 4 to 6 hours every night or longer Do not drive if experiencing excessive daytime fatigue or somnolence   Orders: CXR re: covid  Rx: Prednisone taper as directed   Follow-up 6 months with Dr. Wynona Neat OR sooner if needed

## 2020-07-11 NOTE — Assessment & Plan Note (Addendum)
-   Patient is compliant with CPAP and reports benefit from use - Pressure 11cm h20; residual AHI 1.1  - Advised patient to continue wearing CPAP 4 to 6 hours every night or longer. Do not drive if experiencing excessive daytime fatigue or somnolence  - No changes today - FU in 6 months or sooner if needed

## 2020-07-11 NOTE — Progress Notes (Signed)
@Patient  ID: , male    DOB: 07/14/1980, 40 y.o.   MRN: [id number]  Chief Complaint  Patient presents with  . Follow-up    Covid 06/06/20, wheezing increased since then. Breztri working well.  DME- [employer]     Referring provider: 08/04/20, MD  HPI: 40 year old male, former smoker quit in January 2011 (8-pack-year history).  Past medical history significant for hypertension, acute on chronic respiratory failure, OSA, severe persistent asthma, obesity.  Patient of Dr. February 2011, last seen in office on 03/15/2020.  Maintained on CPAP at 11 cm H2O.  Recently switched to [facility] with improvement in asthma symptoms. Continue to monitor for need for add on biologics.  07/11/2020 Patient presents today for 3-4 month follow-up. He tested positive for covid in mid-January. Since having covid his wheezing has been more noticeable. He was not hospitalized. His symptoms were mild. He has a normal dry cough. No increased in shortness of breath. He is complaint with CPAP, he did not wear it for several days when he had Covid. No issues with pressure setting or mask. Sleeping well at night. He feels more rested the next day, he does not dose off like he used to since starting therapy. His DME company is [employer]-1987.   Airview download 04/12/20-07/10/20 78/90 days used (87%); 56 (62%) > 4 hours Average usage days used 5 hours 6 mins Pressure 11cm h20 Airleaks 17.2L/min (95%) AHI 1.1   No Known Allergies  Immunization History  Administered Date(s) Administered  . Influenza,inj,Quad PF,6+ Mos 02/14/2020  . Influenza,inj,Quad PF,6-35 Mos 02/24/2019  . PFIZER(Purple Top)SARS-COV-2 Vaccination 09/16/2019, 10/07/2019, 04/21/2020    Past Medical History:  Diagnosis Date  . Asthma   . Hypertension     Tobacco History: Social History   Tobacco Use  Smoking Status Former Smoker  . Packs/day: 1.00  . Years: 8.00  . Pack years: 8.00  . Types: Cigarettes  . Quit date: 05/26/2009  . Years  since quitting: 11.1  Smokeless Tobacco Never Used   Counseling given: Not Answered   Outpatient Medications Prior to Visit  Medication Sig Dispense Refill  . albuterol (VENTOLIN HFA) 108 (90 Base) MCG/ACT inhaler Inhale 2 puffs into the lungs every 6 (six) hours as needed for wheezing or shortness of breath.    07/24/2009 amLODipine (NORVASC) 10 MG tablet Take 10 mg by mouth daily.    Marland Kitchen aspirin EC 81 MG EC tablet Take 1 tablet (81 mg total) by mouth daily. 30 tablet 0  . eszopiclone (LUNESTA) 2 MG TABS tablet Take 1 tablet (2 mg total) by mouth at bedtime as needed. for sleep 30 tablet 2  . furosemide (LASIX) 20 MG tablet Take 1 tablet (20 mg total) by mouth daily. 30 tablet 0  . guaiFENesin (MUCINEX) 600 MG 12 hr tablet Take 600 mg by mouth 2 (two) times daily as needed for cough or to loosen phlegm.    Marland Kitchen ipratropium-albuterol (DUONEB) 0.5-2.5 (3) MG/3ML SOLN Take 3 mLs by nebulization every 6 (six) hours as needed. 360 mL 0  . montelukast (SINGULAIR) 10 MG tablet Take 1 tablet (10 mg total) by mouth at bedtime. 30 tablet 11  . Budeson-Glycopyrrol-Formoterol (BREZTRI AEROSPHERE) 160-9-4.8 MCG/ACT AERO Inhale 2 puffs into the lungs 2 (two) times daily. 1 g 5  . Budeson-Glycopyrrol-Formoterol (BREZTRI AEROSPHERE) 160-9-4.8 MCG/ACT AERO Inhale 2 puffs into the lungs in the morning and at bedtime. 10.7 g 0   No facility-administered medications prior to visit.    Review of Systems  Review of Systems  Constitutional: Negative.   Respiratory: Positive for cough and wheezing. Negative for shortness of breath.   Cardiovascular: Negative.    Physical Exam  BP 126/84 (BP Location: Left Arm, Cuff Size: Normal)   Pulse 92   Temp 98 F (36.7 C)   Ht 5\' 7"  (1.702 m)   Wt 295 lb (133.8 kg)   SpO2 97%   BMI 46.20 kg/m  Physical Exam Constitutional:      Appearance: Normal appearance.  HENT:     Head: Normocephalic and atraumatic.     Mouth/Throat:     Comments: Deferred d/t  masking Cardiovascular:     Rate and Rhythm: Normal rate and regular rhythm.  Pulmonary:     Effort: Pulmonary effort is normal.     Breath sounds: Normal breath sounds.  Musculoskeletal:        General: Normal range of motion.  Skin:    General: Skin is warm and dry.  Neurological:     General: No focal deficit present.     Mental Status: He is alert and oriented to person, place, and time. Mental status is at baseline.  Psychiatric:        Mood and Affect: Mood normal.        Behavior: Behavior normal.        Thought Content: Thought content normal.        Judgment: Judgment normal.      Lab Results:  CBC    Component Value Date/Time   WBC 9.6 03/08/2020 0903   RBC 4.65 03/08/2020 0903   HGB 15.8 03/08/2020 0903   HCT 45.9 03/08/2020 0903   PLT 203.0 03/08/2020 0903   MCV 98.7 03/08/2020 0903   MCH 33.6 08/23/2019 0523   MCHC 34.6 03/08/2020 0903   RDW 13.0 03/08/2020 0903   LYMPHSABS 2.4 03/08/2020 0903   MONOABS 0.9 03/08/2020 0903   EOSABS 0.2 03/08/2020 0903   BASOSABS 0.1 03/08/2020 0903    BMET    Component Value Date/Time   NA 134 (L) 08/23/2019 0523   K 4.9 08/23/2019 0523   CL 93 (L) 08/23/2019 0523   CO2 28 08/23/2019 0523   GLUCOSE 111 (H) 08/23/2019 0523   BUN 37 (H) 08/23/2019 0523   CREATININE 0.97 08/23/2019 0523   CALCIUM 9.3 08/23/2019 0523   GFRNONAA >60 08/23/2019 0523   GFRAA >60 08/23/2019 0523    BNP    Component Value Date/Time   BNP 43.0 08/22/2019 0453    ProBNP No results found for: PROBNP  Imaging: No results found.   Assessment & Plan:   OSA (obstructive sleep apnea) - Patient is compliant with CPAP and reports benefit from use - Pressure 11cm h20; residual AHI 1.1  - Advised patient to continue wearing CPAP 4 to 6 hours every night or longer. Do not drive if experiencing excessive daytime fatigue or somnolence  - No changes today - FU in 6 months or sooner if needed   Severe persistent asthma - Acute  exacerbation; patient experiencing increased wheezing since covid dx  - Continue Breztri 2 puffs twice daily; Singulair 10mg  qhs and Albuterol rescue inhaler 2 puffs every 4-6 hours for breakthrough shortness of breath /wheezing - Rx prednisone taper as directed    COVID-19 - Dx in mid-January 2022. Fully vaccinated. Mild symptoms, he was not hospitalized. Checking CXR today d/t wheezing    , NP 07/11/2020

## 2020-07-11 NOTE — Assessment & Plan Note (Addendum)
-   Acute exacerbation; patient experiencing increased wheezing since covid dx  - Continue Breztri 2 puffs twice daily; Singulair 10mg  qhs and Albuterol rescue inhaler 2 puffs every 4-6 hours for breakthrough shortness of breath /wheezing - Rx prednisone taper as directed

## 2020-07-11 NOTE — Assessment & Plan Note (Addendum)
-   Dx in mid-January 2022. Fully vaccinated. Mild symptoms, he was not hospitalized. Checking CXR today d/t wheezing

## 2020-07-12 MED ORDER — PREDNISONE 10 MG PO TABS: ORAL_TABLET | ORAL | 0 refills | Status: DC

## 2020-07-12 NOTE — Telephone Encounter (Signed)
Patients RX for Prednisone was sent to wrong pharmacy. New RX has been sent to preferred. Called [employer] and canceled [employer]. Nothing further needed at this time.

## 2020-07-13 ENCOUNTER — Encounter: Payer: Self-pay | Admitting: *Deleted

## 2020-07-13 NOTE — Progress Notes (Signed)
Please let patient know CXR was normal. Lungs clear. Cardiac silhouette normal.

## 2020-08-22 ENCOUNTER — Encounter (HOSPITAL_BASED_OUTPATIENT_CLINIC_OR_DEPARTMENT_OTHER): Payer: Self-pay | Admitting: Orthopedic Surgery

## 2020-08-22 ENCOUNTER — Other Ambulatory Visit: Payer: Self-pay

## 2020-08-22 NOTE — Progress Notes (Addendum)
ADDENDUM:  Chart reviewed w/ anesthesia, Dr Glade Stanford MDA, to be assessed dos due to asthma.   Spoke w/ via phone for pre-op interview--- PT Lab needs dos---- Istat and EKG              Lab results------ current cxr in epic  COVID test ------ no test needed.  Pt stated tested positive 06-06-2020 which is within 90 day window per policy.  Pt does not have test result done at [facility] thru health department but pt pulmonology provider stated in office note in epic 07-11-2020 positive covid 06-06-2020 (printed copy in chart)  Arrive at ------- 1030 on 08-28-2020 NPO after MN NO Solid Food.  Clear liquids from MN until--- 0930 Med rec completed Medications to take morning of surgery ----- Norvasc, Breztri inhaler Diabetic medication ----- n/a  Patient instructed to bring photo id and insurance card day of surgery Patient aware to have Driver (ride ) / caregiver    for 24 hours after surgery -- wife, Shanda Bumps  Patient Special Instructions ----- pt to do nebulizer night before and morning of surgery as prevention and asked to bring cpap/ mask/ tubing Pre-Op special Istructions ----- n/a Patient verbalized understanding of instructions that were given at this phone interview. Patient denies shortness of breath, chest pain, fever, cough at this phone interview.   Anesthesia Review:  HTN; Severe persistent asthma;  Hx hypercapnic respiratory failure/ cor pulmonale, admission 03/ 2021 epic;   Mod OSA w/ cpap (nightly per pt);  Pt denies any cardiac s&s and no peripheral swelling.  Does state has sob and occasional wheezing due to asthma last used rescue inhaler last week and last used nebulizer Jan 2022.  Last asthma exacerbation 06-2020 per pulm note , prednisone taper given, and had normal cxr done.  PCP: Dr Wyline Beady Pulmonology:  Dr A. Olalere (lov 07-11-2020 epic) Chest x-ray :  07-11-2020 epic EKG :  08-17-2019 epic Echo : 08-17-2019 epic Stress test:  no Activity level:   Sob w/  exertion due to asthma Sleep Study/ CPAP :  YES/ YES Blood Thinner/ Instructions Maurice Small Dose: NO ASA / Instructions/ Last Dose : ASA 81mg /  Pt stated given instructions from Dr office to stop 5 days prior to surgery, stated last dose will be 08-23-2020

## 2020-08-24 ENCOUNTER — Other Ambulatory Visit (HOSPITAL_COMMUNITY): Payer: Managed Care, Other (non HMO)

## 2020-08-28 ENCOUNTER — Ambulatory Visit (HOSPITAL_BASED_OUTPATIENT_CLINIC_OR_DEPARTMENT_OTHER): Payer: Managed Care, Other (non HMO) | Admitting: Anesthesiology

## 2020-08-28 ENCOUNTER — Encounter (HOSPITAL_BASED_OUTPATIENT_CLINIC_OR_DEPARTMENT_OTHER): Payer: Self-pay | Admitting: Orthopedic Surgery

## 2020-08-28 ENCOUNTER — Other Ambulatory Visit: Payer: Self-pay

## 2020-08-28 ENCOUNTER — Encounter (HOSPITAL_BASED_OUTPATIENT_CLINIC_OR_DEPARTMENT_OTHER)
Admission: RE | Disposition: A | Discharge status: Home / Self Care | Payer: Self-pay | Source: Home / Self Care | Attending: Orthopedic Surgery

## 2020-08-28 ENCOUNTER — Ambulatory Visit (HOSPITAL_BASED_OUTPATIENT_CLINIC_OR_DEPARTMENT_OTHER)
Admission: RE | Admit: 2020-08-28 | Discharge: 2020-08-28 | Disposition: A | Discharge status: Home / Self Care | Payer: Managed Care, Other (non HMO) | Attending: Orthopedic Surgery | Admitting: Orthopedic Surgery

## 2020-08-28 DIAGNOSIS — Y939 Activity, unspecified: Secondary | ICD-10-CM | POA: Insufficient documentation

## 2020-08-28 DIAGNOSIS — S83231A Complex tear of medial meniscus, current injury, right knee, initial encounter: Secondary | ICD-10-CM | POA: Diagnosis not present

## 2020-08-28 DIAGNOSIS — M94261 Chondromalacia, right knee: Secondary | ICD-10-CM | POA: Insufficient documentation

## 2020-08-28 DIAGNOSIS — Z7982 Long term (current) use of aspirin: Secondary | ICD-10-CM | POA: Diagnosis not present

## 2020-08-28 DIAGNOSIS — Z8616 Personal history of COVID-19: Secondary | ICD-10-CM | POA: Diagnosis not present

## 2020-08-28 DIAGNOSIS — Z87891 Personal history of nicotine dependence: Secondary | ICD-10-CM | POA: Insufficient documentation

## 2020-08-28 DIAGNOSIS — Z825 Family history of asthma and other chronic lower respiratory diseases: Secondary | ICD-10-CM | POA: Insufficient documentation

## 2020-08-28 DIAGNOSIS — Z79899 Other long term (current) drug therapy: Secondary | ICD-10-CM | POA: Diagnosis not present

## 2020-08-28 DIAGNOSIS — S83241A Other tear of medial meniscus, current injury, right knee, initial encounter: Secondary | ICD-10-CM | POA: Diagnosis present

## 2020-08-28 DIAGNOSIS — X58XXXA Exposure to other specified factors, initial encounter: Secondary | ICD-10-CM | POA: Insufficient documentation

## 2020-08-28 DIAGNOSIS — Z7951 Long term (current) use of inhaled steroids: Secondary | ICD-10-CM | POA: Insufficient documentation

## 2020-08-28 DIAGNOSIS — J4550 Severe persistent asthma, uncomplicated: Secondary | ICD-10-CM | POA: Insufficient documentation

## 2020-08-28 HISTORY — DX: Severe persistent asthma, uncomplicated: J45.50

## 2020-08-28 HISTORY — DX: Presence of external hearing-aid: Z97.4

## 2020-08-28 HISTORY — PX: KNEE ARTHROSCOPY WITH MEDIAL MENISECTOMY: SHX5651

## 2020-08-28 HISTORY — DX: Presence of spectacles and contact lenses: Z97.3

## 2020-08-28 HISTORY — DX: Unspecified tear of unspecified meniscus, current injury, right knee, initial encounter: S83.206A

## 2020-08-28 HISTORY — DX: Obstructive sleep apnea (adult) (pediatric): G47.33

## 2020-08-28 LAB — POCT I-STAT, CHEM 8
BUN: 17 mg/dL (ref 6–20)
Calcium, Ion: 1.26 mmol/L (ref 1.15–1.40)
Chloride: 97 mmol/L — ABNORMAL LOW (ref 98–111)
Creatinine, Ser: 0.9 mg/dL (ref 0.61–1.24)
Glucose, Bld: 126 mg/dL — ABNORMAL HIGH (ref 70–99)
HCT: 48 % (ref 39.0–52.0)
Hemoglobin: 16.3 g/dL (ref 13.0–17.0)
Potassium: 3.8 mmol/L (ref 3.5–5.1)
Sodium: 139 mmol/L (ref 135–145)
TCO2: 28 mmol/L (ref 22–32)

## 2020-08-28 SURGERY — ARTHROSCOPY, KNEE, WITH MEDIAL MENISCECTOMY
Anesthesia: General | Site: Knee | Laterality: Right

## 2020-08-28 MED ORDER — OXYCODONE HCL 5 MG PO TABS
ORAL_TABLET | ORAL | Status: AC
  Filled 2020-08-28: qty 1

## 2020-08-28 MED ORDER — HYDROCODONE-ACETAMINOPHEN 7.5-325 MG PO TABS: 1.0000 | ORAL_TABLET | ORAL | 0 refills | Status: AC | PRN

## 2020-08-28 MED ORDER — OXYCODONE HCL 5 MG/5ML PO SOLN: 5.0000 mg | Freq: Once | ORAL | Status: AC | PRN

## 2020-08-28 MED ORDER — PROMETHAZINE HCL 25 MG/ML IJ SOLN: 6.2500 mg | INTRAMUSCULAR | Status: DC | PRN

## 2020-08-28 MED ORDER — FENTANYL CITRATE (PF) 100 MCG/2ML IJ SOLN
INTRAMUSCULAR | Status: AC
  Filled 2020-08-28: qty 2

## 2020-08-28 MED ORDER — ONDANSETRON HCL 4 MG/2ML IJ SOLN
INTRAMUSCULAR | Status: DC | PRN
  Administered 2020-08-28: 4 mg via INTRAVENOUS

## 2020-08-28 MED ORDER — LIDOCAINE 2% (20 MG/ML) 5 ML SYRINGE
INTRAMUSCULAR | Status: DC | PRN
  Administered 2020-08-28: 60 mg via INTRAVENOUS

## 2020-08-28 MED ORDER — LIDOCAINE 2% (20 MG/ML) 5 ML SYRINGE
INTRAMUSCULAR | Status: AC
  Filled 2020-08-28: qty 5

## 2020-08-28 MED ORDER — KETOROLAC TROMETHAMINE 30 MG/ML IJ SOLN
INTRAMUSCULAR | Status: DC | PRN
  Administered 2020-08-28: 30 mg via INTRAVENOUS

## 2020-08-28 MED ORDER — CEFAZOLIN SODIUM-DEXTROSE 2-4 GM/100ML-% IV SOLN
INTRAVENOUS | Status: AC
  Filled 2020-08-28: qty 100

## 2020-08-28 MED ORDER — FENTANYL CITRATE (PF) 100 MCG/2ML IJ SOLN
25.0000 ug | INTRAMUSCULAR | Status: DC | PRN
  Administered 2020-08-28 (×2): 25 ug via INTRAVENOUS

## 2020-08-28 MED ORDER — LACTATED RINGERS IV SOLN: INTRAVENOUS | Status: DC

## 2020-08-28 MED ORDER — DEXAMETHASONE SODIUM PHOSPHATE 10 MG/ML IJ SOLN
INTRAMUSCULAR | Status: DC | PRN
  Administered 2020-08-28: 8 mg via INTRAVENOUS

## 2020-08-28 MED ORDER — ONDANSETRON 4 MG PO TBDP: 4.0000 mg | ORAL_TABLET | Freq: Three times a day (TID) | ORAL | 0 refills | Status: AC | PRN

## 2020-08-28 MED ORDER — MIDAZOLAM HCL 5 MG/5ML IJ SOLN
INTRAMUSCULAR | Status: DC | PRN
  Administered 2020-08-28: 2 mg via INTRAVENOUS

## 2020-08-28 MED ORDER — BUPIVACAINE HCL 0.25 % IJ SOLN
INTRAMUSCULAR | Status: DC | PRN
  Administered 2020-08-28: 16 mL

## 2020-08-28 MED ORDER — PROPOFOL 10 MG/ML IV BOLUS
INTRAVENOUS | Status: AC
  Filled 2020-08-28: qty 20

## 2020-08-28 MED ORDER — SODIUM CHLORIDE 0.9 % IR SOLN
Status: DC | PRN
  Administered 2020-08-28: 6000 mL

## 2020-08-28 MED ORDER — PROPOFOL 10 MG/ML IV BOLUS
INTRAVENOUS | Status: DC | PRN
  Administered 2020-08-28 (×2): 200 mg via INTRAVENOUS

## 2020-08-28 MED ORDER — MIDAZOLAM HCL 2 MG/2ML IJ SOLN
INTRAMUSCULAR | Status: AC
  Filled 2020-08-28: qty 2

## 2020-08-28 MED ORDER — KETOROLAC TROMETHAMINE 30 MG/ML IJ SOLN
INTRAMUSCULAR | Status: AC
  Filled 2020-08-28: qty 1

## 2020-08-28 MED ORDER — DEXAMETHASONE SODIUM PHOSPHATE 10 MG/ML IJ SOLN
INTRAMUSCULAR | Status: AC
  Filled 2020-08-28: qty 1

## 2020-08-28 MED ORDER — ONDANSETRON HCL 4 MG/2ML IJ SOLN
INTRAMUSCULAR | Status: AC
  Filled 2020-08-28: qty 2

## 2020-08-28 MED ORDER — AMISULPRIDE (ANTIEMETIC) 5 MG/2ML IV SOLN
10.0000 mg | Freq: Once | INTRAVENOUS | Status: DC | PRN
Start: 2020-08-28 — End: 2020-08-28

## 2020-08-28 MED ORDER — CEFAZOLIN SODIUM-DEXTROSE 1-4 GM/50ML-% IV SOLN
INTRAVENOUS | Status: AC
  Filled 2020-08-28: qty 50

## 2020-08-28 MED ORDER — ACETAMINOPHEN 500 MG PO TABS
ORAL_TABLET | ORAL | Status: AC
  Filled 2020-08-28: qty 2

## 2020-08-28 MED ORDER — FENTANYL CITRATE (PF) 100 MCG/2ML IJ SOLN
INTRAMUSCULAR | Status: DC | PRN
  Administered 2020-08-28 (×2): 50 ug via INTRAVENOUS

## 2020-08-28 MED ORDER — OXYCODONE HCL 5 MG PO TABS
5.0000 mg | ORAL_TABLET | Freq: Once | ORAL | Status: AC | PRN
  Administered 2020-08-28: 5 mg via ORAL

## 2020-08-28 MED ORDER — ARTIFICIAL TEARS OPHTHALMIC OINT
TOPICAL_OINTMENT | OPHTHALMIC | Status: AC
  Filled 2020-08-28: qty 3.5

## 2020-08-28 MED ORDER — ACETAMINOPHEN 500 MG PO TABS
1000.0000 mg | ORAL_TABLET | Freq: Once | ORAL | Status: AC
  Administered 2020-08-28: 1000 mg via ORAL

## 2020-08-28 MED ORDER — CEFAZOLIN SODIUM-DEXTROSE 2-4 GM/100ML-% IV SOLN
2.0000 g | INTRAVENOUS | Status: AC
  Administered 2020-08-28: 3 g via INTRAVENOUS

## 2020-08-28 SURGICAL SUPPLY — 28 items
BLADE SHAVER TORPEDO 4X13 (MISCELLANEOUS) ×1 IMPLANT
BNDG ELASTIC 6X5.8 VLCR STR LF (GAUZE/BANDAGES/DRESSINGS) ×2 IMPLANT
BNDG GAUZE ELAST 4 BULKY (GAUZE/BANDAGES/DRESSINGS) ×1 IMPLANT
COVER WAND RF STERILE (DRAPES) ×2 IMPLANT
DRAPE ARTHROSCOPY W/POUCH 114 (DRAPES) ×2 IMPLANT
DRAPE U-SHAPE 47X51 STRL (DRAPES) ×2 IMPLANT
DRSG PAD ABDOMINAL 8X10 ST (GAUZE/BANDAGES/DRESSINGS) ×2 IMPLANT
DURAPREP 26ML APPLICATOR (WOUND CARE) ×2 IMPLANT
GAUZE SPONGE 4X4 12PLY STRL (GAUZE/BANDAGES/DRESSINGS) ×2 IMPLANT
GLOVE SURG ENC MOIS LTX SZ7 (GLOVE) ×1 IMPLANT
GLOVE SURG ENC MOIS LTX SZ7.5 (GLOVE) ×5 IMPLANT
GLOVE SURG UNDER LTX SZ8 (GLOVE) ×4 IMPLANT
GLOVE SURG UNDER POLY LF SZ6.5 (GLOVE) ×1 IMPLANT
GLOVE SURG UNDER POLY LF SZ7 (GLOVE) ×1 IMPLANT
GLOVE SURG UNDER POLY LF SZ7.5 (GLOVE) ×1 IMPLANT
GOWN STRL REUS W/TWL XL LVL3 (GOWN DISPOSABLE) ×4 IMPLANT
IV NS IRRIG 3000ML ARTHROMATIC (IV SOLUTION) ×4 IMPLANT
KIT TURNOVER CYSTO (KITS) ×2 IMPLANT
MANIFOLD NEPTUNE II (INSTRUMENTS) ×2 IMPLANT
PACK ARTHROSCOPY DSU (CUSTOM PROCEDURE TRAY) ×2 IMPLANT
PACK BASIN DAY SURGERY FS (CUSTOM PROCEDURE TRAY) ×2 IMPLANT
STRIP CLOSURE SKIN 1/2X4 (GAUZE/BANDAGES/DRESSINGS) ×1 IMPLANT
SUT MNCRL AB 3-0 PS2 27 (SUTURE) ×1 IMPLANT
TOWEL OR 17X26 10 PK STRL BLUE (TOWEL DISPOSABLE) ×2 IMPLANT
TUBE CONNECTING 12X1/4 (SUCTIONS) ×4 IMPLANT
TUBING ARTHROSCOPY IRRIG 16FT (MISCELLANEOUS) ×2 IMPLANT
WATER STERILE IRR 500ML POUR (IV SOLUTION) ×2 IMPLANT
WRAP KNEE MAXI GEL POST OP (GAUZE/BANDAGES/DRESSINGS) ×1 IMPLANT

## 2020-08-28 NOTE — Anesthesia Preprocedure Evaluation (Addendum)
Anesthesia Evaluation  Patient identified by MRN, date of birth, ID band Patient awake    Reviewed: Allergy & Precautions, NPO status , Patient's Chart, lab work & pertinent test results  Airway Mallampati: II  TM Distance: >3 FB Neck ROM: Full    Dental no notable dental hx. (+) Dental Advisory Given, Chipped, Poor Dentition,    Pulmonary asthma , sleep apnea and Continuous Positive Airway Pressure Ventilation , former smoker,    Pulmonary exam normal breath sounds clear to auscultation       Cardiovascular hypertension, Pt. on medications Normal cardiovascular exam Rhythm:Regular Rate:Normal     Neuro/Psych negative neurological ROS  negative psych ROS   GI/Hepatic negative GI ROS, Neg liver ROS,   Endo/Other  Morbid obesity  Renal/GU negative Renal ROS     Musculoskeletal negative musculoskeletal ROS (+)   Abdominal (+) + obese,   Peds  Hematology negative hematology ROS (+)   Anesthesia Other Findings Right knee medial meniscus tear  Reproductive/Obstetrics                           Anesthesia Physical Anesthesia Plan  ASA: III  Anesthesia Plan: General   Post-op Pain Management:    Induction: Intravenous  PONV Risk Score and Plan: 2 and Ondansetron, Dexamethasone, Midazolam and Treatment may vary due to age or medical condition  Airway Management Planned: LMA  Additional Equipment:   Intra-op Plan:   Post-operative Plan: Extubation in OR  Informed Consent: I have reviewed the patients History and Physical, chart, labs and discussed the procedure including the risks, benefits and alternatives for the proposed anesthesia with the patient or authorized representative who has indicated his/her understanding and acceptance.     Dental advisory given  Plan Discussed with: CRNA  Anesthesia Plan Comments: (Post-op regional anesthesia discussed with patient)        Anesthesia Quick Evaluation

## 2020-08-28 NOTE — Progress Notes (Signed)
Orthopedic Tech Progress Note Patient Details:  EWALD BEG 07/18/1980 [id number]  Ortho Devices Type of Ortho Device: Crutches Ortho Device/Splint Location: Bariatric crutches Ortho Device/Splint Interventions: Application   Post Interventions Patient Tolerated: Well Instructions Provided: Care of device   Saul Fordyce 08/28/2020, 2:47 PM

## 2020-08-28 NOTE — H&P (Signed)
ORTHOPAEDIC H and P  REQUESTING PHYSICIAN: Yolonda Kida, MD  PCP:  Catha Gosselin, MD  Chief Complaint: Right knee pain  HPI: Joseph [name] is a 40 y.o. male who complains of right knee pain.  Recalcitrant to conservative management.  Is here today for arthroscopy.  No new complaints.  Past Medical History:  Diagnosis Date  . History of 2019 novel coronavirus disease (COVID-19) 06/06/2020   per pt had positive coivd test done at [facility] in [place] w/ [place] [facility],  had mild symptoms and exacerbation of asthma,  all resolved per pt (pt pulmonology provider states in office note in epic 07-11-2020 dx 06-06-2020)  . History of acute respiratory failure 08/17/2019   hypoxic and hypercapnic, admission in epic  . History of cor pulmonale 08/17/2019  . Hypertension   . OSA on CPAP    followed by dr a. Wynona Neat---  moderate osa per study 09-30-2019 in epic  . Right knee meniscal tear   . Severe persistent asthma    pulmonology---- dr a. Wynona Neat  . Wears glasses   . Wears hearing aid in both ears    Past Surgical History:  Procedure Laterality Date  . NO PAST SURGERIES    . WISDOM TOOTH EXTRACTION  age 56   Social History   Socioeconomic History  . Marital status: Single    Spouse name: Not on file  . Number of children: Not on file  . Years of education: Not on file  . Highest education level: Not on file  Occupational History  . Not on file  Tobacco Use  . Smoking status: Former Smoker    Packs/day: 1.00    Years: 8.00    Pack years: 8.00    Types: Cigarettes    Quit date: 05/26/2009    Years since quitting: 11.2  . Smokeless tobacco: Never Used  Vaping Use  . Vaping Use: Never used  Substance and Sexual Activity  . Alcohol use: Yes  . Drug use: Never  . Sexual activity: Not on file  Other Topics Concern  . Not on file  Social History Narrative  . Not on file   Social Determinants of Health   Financial Resource Strain: Not on file  Food  Insecurity: Not on file  Transportation Needs: Not on file  Physical Activity: Not on file  Stress: Not on file  Social Connections: Not on file   Family History  Problem Relation Age of Onset  . Asthma Father    No Known Allergies Prior to Admission medications   Medication Sig Start Date End Date Taking? Authorizing Provider  albuterol (VENTOLIN HFA) 108 (90 Base) MCG/ACT inhaler Inhale 2 puffs into the lungs every 6 (six) hours as needed for wheezing or shortness of breath. 08/14/19  Yes [provider]  amLODipine (NORVASC) 10 MG tablet Take 10 mg by mouth daily. 08/11/19  Yes [provider]  aspirin EC 81 MG EC tablet Take 1 tablet (81 mg total) by mouth daily. 08/23/19  Yes Regalado, Belkys A, MD  Budeson-Glycopyrrol-Formoterol (BREZTRI AEROSPHERE) 160-9-4.8 MCG/ACT AERO Inhale 2 puffs into the lungs 2 (two) times daily. 07/11/20  Yes Glenford Bayley, NP  furosemide (LASIX) 20 MG tablet Take 1 tablet (20 mg total) by mouth daily. 08/23/19  Yes Regalado, Belkys A, MD  ipratropium-albuterol (DUONEB) 0.5-2.5 (3) MG/3ML SOLN Take 3 mLs by nebulization every 6 (six) hours as needed. 08/23/19  Yes Regalado, Belkys A, MD  montelukast (SINGULAIR) 10 MG  tablet Take 1 tablet (10 mg total) by mouth at bedtime. 02/14/20  Yes Parrett, Tammy S, NP  eszopiclone (LUNESTA) 2 MG TABS tablet Take 1 tablet (2 mg total) by mouth at bedtime as needed. for sleep 05/25/20   Tomma Lightning, MD   No results found.  Positive ROS: All other systems have been reviewed and were otherwise negative with the exception of those mentioned in the HPI and as above.  Physical Exam: General: Alert, no acute distress Cardiovascular: No pedal edema Respiratory: No cyanosis, no use of accessory musculature GI: No organomegaly, abdomen is soft and non-tender Skin: No lesions in the area of chief complaint Neurologic: Sensation intact distally Psychiatric: Patient is competent for consent with normal  mood and affect Lymphatic: No axillary or cervical lymphadenopathy  MUSCULOSKELETAL:  Right lower extremity is warm and well-perfused.  No open wounds.  Neurovascularly intact.  Assessment: Right knee medial meniscus tear, acute.  Plan: -Plan for right knee arthroscopy with partial medial meniscectomy and debridements as indicated.  We again reviewed the risk and benefits of this procedure.  Questions were solicited and answered to his satisfaction.  Informed consent was obtained.  -DC home from PACU postop.    Yolonda Kida, MD Cell [phone]    08/28/2020 12:36 PM

## 2020-08-28 NOTE — Brief Op Note (Signed)
08/28/2020  1:22 PM  PATIENT:  Joseph [name]  40 y.o. male  PRE-OPERATIVE DIAGNOSIS:  Right knee medial meniscus tear  POST-OPERATIVE DIAGNOSIS:  Right knee medial meniscus tear, Right knee chondromalacia  PROCEDURE:  Procedure(s): KNEE ARTHROSCOPY WITH PARTIAL MEDIAL MENISECTOMY (Right)  SURGEON:  Surgeon(s) and Role:    * Yolonda Kida, MD - Primary  PHYSICIAN ASSISTANT: Dion Saucier, PA-C   ANESTHESIA:   general  EBL:  3 cc  BLOOD ADMINISTERED:none  DRAINS: none   LOCAL MEDICATIONS USED:  NONE  SPECIMEN:  No Specimen  DISPOSITION OF SPECIMEN:  N/A  COUNTS:  YES  TOURNIQUET:  * No tourniquets in log *  DICTATION: .Note written in EPIC  PLAN OF CARE: Discharge to home after PACU  PATIENT DISPOSITION:  PACU - hemodynamically stable.   Delay start of Pharmacological VTE agent (>24hrs) due to surgical blood loss or risk of bleeding: not applicable

## 2020-08-28 NOTE — Op Note (Addendum)
Surgery Date: 08/28/2020  Surgeon(s): Yolonda Kida, MD  Assistant: Joseph Saucier, PA-C  Assistant attestation: PA Mcclung was present and utilized throughout the entire procedure.  He was critical for prepping and draping, manipulating the knee, and performing the meniscectomy.  He was also utilized for closure.  ANESTHESIA:  general  FLUIDS: Per anesthesia record.   ESTIMATED BLOOD LOSS: minimal  PREOPERATIVE DIAGNOSES:  1. Right knee medial meniscus tear 2.  Right knee synovitis 3.  Right knee medial femoral condyle chondromalacia  POSTOPERATIVE DIAGNOSES:  same  PROCEDURES PERFORMED:  1 Right. knee arthroscopy with arthroscopic partial medial meniscectomy 2. Right knee arthroscopy with arthroscopic chondroplasty media femoral   DESCRIPTION OF PROCEDURE: Joseph [name] is a 40 y.o.-year-old male with Right knee medial meniscus tear. Plans are to proceed with partial medial meniscectomy and diagnostic arthroscopy with debridement as indicated. Full discussion held regarding risks benefits alternatives and complications related surgical intervention. Conservative care options reviewed. All questions answered.  The patient was identified in the preoperative holding area and the operative extremity was marked. The patient was brought to the operating room and transferred to operating table in a supine position. Satisfactory general anesthesia was induced by anesthesiology.    Standard anterolateral, anteromedial arthroscopy portals were obtained. The anteromedial portal was obtained with a spinal needle for localization under direct visualization with subsequent diagnostic findings.   Anteromedial and anterolateral chambers: moderate synovitis. The synovitis was debrided with a 4.5 mm full radius shaver through both the anteromedial and lateral portals.   Suprapatellar pouch and gutters: mild synovitis or debris. Patella chondral surface: Grade 1 Trochlear chondral surface:  Grade 0 Patellofemoral tracking: level and midline Medial meniscus: complex tearing of the mid body and posterior horn, parrot beak and horizontal cleavage.  Medial femoral condyle flexion bearing surface: Grade 2 Medial femoral condyle extension bearing surface: Grade 3 Medial tibial plateau: Grade 0 Anterior cruciate ligament:stable Posterior cruciate ligament:stable Lateral meniscus: intact.   Lateral femoral condyle flexion bearing surface: Grade 0 Lateral femoral condyle extension bearing surface: Grade 1 Lateral tibial plateau: Grade 0  Partial medial meniscectomy was carried out with combination of basket biter as well as full-radius shaver.  Resection was carried back to unstable transition zone with the stable remnant meniscus.  Total meniscectomy surface area removed was less than 20%.   After completion of synovectomy, diagnostic exam, and debridements as described, all compartments were checked and no residual debris remained. Hemostasis was achieved with the cautery wand. The portals were approximated with buried monocryl. All excess fluid was expressed from the joint.  Xeroform sterile gauze dressings were applied followed by Ace bandage and ice pack.   DISPOSITION: The patient was awakened from general anesthetic, extubated, taken to the recovery room in medically stable condition, no apparent complications. The patient may be weightbearing as tolerated to the operative lower extremity.  Range of motion of right knee as tolerated.

## 2020-08-28 NOTE — Discharge Instructions (Signed)
Post-operative patient instructions  Knee Arthroscopy   . Ice:  Place intermittent ice or cooler pack over your knee, 30 minutes on and 30 minutes off.  Continue this for the first 72 hours after surgery, then save ice for use after therapy sessions or on more active days.   . Weight:  You may bear weight on your leg as your symptoms allow. . Crutches:  Use crutches (or walker) to assist in walking until told to discontinue by your physical therapist or physician. This will help to reduce pain. . Strengthening:  Perform simple thigh squeezes (isometric quad contractions) and straight leg lifts as you are able (3 sets of 5 to 10 repetitions, 3 times a day).  For the leg lifts, have someone support under your ankle in the beginning until you have increased strength enough to do this on your own.  To help get started on thigh squeezes, place a pillow under your knee and push down on the pillow with back of knee (sometimes easier to do than with your leg fully straight). . Motion:  Perform gentle knee motion as tolerated - this is gentle bending and straightening of the knee. Seated heel slides: you can start by sitting in a chair, remove your brace, and gently slide your heel back on the floor - allowing your knee to bend. Have someone help you straighten your knee (or use your other leg/foot hooked under your ankle.  . Dressing:  Perform 1st dressing change at 3 days postoperative. A moderate amount of blood tinged drainage is to be expected.  So if you bleed through the dressing on the first or second day or if you have fevers, it is fine to change the dressing/check the wounds early and redress wound. Elevate your leg.  If it bleeds through again, or if the incisions are leaking frank blood, please call the office. May change dressing every 1-2 days thereafter to help watch wounds. Can purchase Tegderm (or 69M Nexcare) water resistant dressings at local pharmacy / [employer]. . Shower:  Light shower is ok after  3 days.  Please take shower, NO bath. Recover with gauze and ace wrap to help keep wounds protected.   . Pain medication:  A narcotic pain medication has been prescribed.  Take as directed.  Typically you need narcotic pain medication more regularly during the first 3 to 5 days after surgery.  Decrease your use of the medication as the pain improves.  Narcotics can sometimes cause constipation, even after a few doses.  If you have problems with constipation, you can take an over the counter stool softener or light laxative.  If you have persistent problems, please notify your physician's office. . DVT prophylaxis:  Take an 81 mg aspirin daily for 6 weeks. Marland Kitchen Physical therapy: Additional activity guidelines to be provided by your physician or physical therapist at follow-up visits.  . Driving: Do not recommend driving x 1-2 weeks post surgical, especially if surgery performed on right side. Should not drive while taking narcotic pain medications. It typically takes at least 2 weeks to restore sufficient neuromuscular function for normal reaction times for driving safety.  . Call [phone] for questions or problems. Evenings you will be forwarded to the hospital operator.  Ask for the orthopaedic physician on call. Please call if you experience:    o Redness, foul smelling, or persistent drainage from the surgical site  o worsening knee pain and swelling not responsive to medication  o any calf pain and  or swelling of the lower leg  o temperatures greater than 101.5 F o other questions or concerns   Thank you for allowing [place] to be a part of your care  Post Anesthesia Home Care Instructions  Activity: Get plenty of rest for the remainder of the day. A responsible individual must stay with you for 24 hours following the procedure.  For the next 24 hours, DO NOT: -Drive a car -Advertising copywriter -Drink alcoholic beverages -Take any medication unless instructed by your physician -Make any legal  decisions or sign important papers.  Meals: Start with liquid foods such as gelatin or soup. Progress to regular foods as tolerated. Avoid greasy, spicy, heavy foods. If nausea and/or vomiting occur, drink only clear liquids until the nausea and/or vomiting subsides. Call your physician if vomiting continues.  Special Instructions/Symptoms: Your throat may feel dry or sore from the anesthesia or the breathing tube placed in your throat during surgery. If this causes discomfort, gargle with warm salt water. The discomfort should disappear within 24 hours.  May take Tylenol at 5 PM for pain. May take Ibuprofen for pain at 7 PM.

## 2020-08-28 NOTE — Transfer of Care (Signed)
Immediate Anesthesia Transfer of Care Note  Patient: Joseph [name]  Procedure(s) Performed: KNEE ARTHROSCOPY WITH PARTIAL MEDIAL MENISECTOMY (Right Knee)  Patient Location: PACU  Anesthesia Type:General  Level of Consciousness: drowsy  Airway & Oxygen Therapy: Patient Spontanous Breathing and Patient connected to nasal cannula oxygen  Post-op Assessment: Report given to RN  Post vital signs: Reviewed and stable  Last Vitals: 145/85 Vitals Value Taken Time  BP 220/194 08/28/20 1335  Temp    Pulse 85 08/28/20 1338  Resp 17 08/28/20 1338  SpO2 96 % 08/28/20 1338  Vitals shown include unvalidated device data.  Last Pain:  Vitals:   08/28/20 1111  TempSrc: Oral  PainSc: 0-No pain      Patients Stated Pain Goal: 5 (08/28/20 1111)  Complications: No complications documented.

## 2020-08-28 NOTE — Anesthesia Postprocedure Evaluation (Signed)
Anesthesia Post Note  Patient: FLAY GHOSH  Procedure(s) Performed: KNEE ARTHROSCOPY WITH PARTIAL MEDIAL MENISECTOMY (Right Knee)     Patient location during evaluation: PACU Anesthesia Type: General Level of consciousness: awake Pain management: pain level controlled Vital Signs Assessment: post-procedure vital signs reviewed and stable Respiratory status: spontaneous breathing, nonlabored ventilation, respiratory function stable and patient connected to nasal cannula oxygen Cardiovascular status: blood pressure returned to baseline and stable Postop Assessment: no apparent nausea or vomiting Anesthetic complications: no   No complications documented.  Last Vitals:  Vitals:   08/28/20 1415 08/28/20 1501  BP: 140/78 140/74  Pulse: 83 (!) 105  Resp: 11 12  Temp:  36.9 C  SpO2: 92% 93%    Last Pain:  Vitals:   08/28/20 1501  TempSrc:   PainSc: 5                  Lorilynn Lehr P Samarra Ridgely

## 2020-08-28 NOTE — Anesthesia Procedure Notes (Signed)
Procedure Name: LMA Insertion Date/Time: 08/28/2020 12:50 PM Performed by: Briant Sites, CRNA Pre-anesthesia Checklist: Patient identified, Emergency Drugs available, Suction available and Patient being monitored Patient Re-evaluated:Patient Re-evaluated prior to induction Oxygen Delivery Method: Circle system utilized Preoxygenation: Pre-oxygenation with 100% oxygen Induction Type: IV induction Ventilation: Mask ventilation without difficulty LMA: LMA inserted LMA Size: 5.0 Number of attempts: 2 Placement Confirmation: positive ETCO2 Tube secured with: Tape Dental Injury: Teeth and Oropharynx as per pre-operative assessment  Comments: Attempted LMA 5 with gastric port; unable to seat -- LMA 5 straight inserted, +EtC02

## 2020-08-29 ENCOUNTER — Encounter (HOSPITAL_BASED_OUTPATIENT_CLINIC_OR_DEPARTMENT_OTHER): Payer: Self-pay | Admitting: Orthopedic Surgery

## 2020-11-20 ENCOUNTER — Other Ambulatory Visit: Payer: Self-pay | Admitting: Pulmonary Disease

## 2020-11-20 NOTE — Telephone Encounter (Signed)
Patient requesting refill on Lunesta 2 mg. Last refill sent in on 05/25/20. Last OV 07/11/20. Is it ok to refill?  AO please advised.  Thank you

## 2020-11-21 NOTE — Telephone Encounter (Signed)
Received message from patient asking for a refill on his Lunesta 2mg . He was last seen by [facility] on 07/11/20 and was advised to follow up in 1 year. 07/13/20 was last sent in on 05/25/20 for 30 tablets with 2 refills.   Pharmacy is 05/27/20 on [facility].   AO, please advise if you are ok with this refill. Thanks!

## 2020-11-23 ENCOUNTER — Other Ambulatory Visit: Payer: Self-pay | Admitting: Pulmonary Disease

## 2020-11-26 ENCOUNTER — Other Ambulatory Visit: Payer: Self-pay | Admitting: Pulmonary Disease

## 2020-11-26 MED ORDER — ESZOPICLONE 2 MG PO TABS: 2.0000 mg | ORAL_TABLET | Freq: Every evening | ORAL | 3 refills | Status: DC | PRN

## 2020-12-25 ENCOUNTER — Other Ambulatory Visit: Payer: Self-pay | Admitting: Adult Health

## 2021-02-19 ENCOUNTER — Other Ambulatory Visit: Payer: Self-pay | Admitting: Adult Health

## 2021-06-08 ENCOUNTER — Other Ambulatory Visit: Payer: Self-pay | Admitting: Pulmonary Disease

## 2021-06-11 NOTE — Telephone Encounter (Signed)
Please advise on med refill. 

## 2021-08-27 ENCOUNTER — Encounter: Payer: Self-pay | Admitting: Adult Health

## 2021-08-27 ENCOUNTER — Ambulatory Visit (INDEPENDENT_AMBULATORY_CARE_PROVIDER_SITE_OTHER): Payer: Managed Care, Other (non HMO)

## 2021-08-27 ENCOUNTER — Ambulatory Visit (INDEPENDENT_AMBULATORY_CARE_PROVIDER_SITE_OTHER): Payer: Managed Care, Other (non HMO) | Admitting: Adult Health

## 2021-08-27 VITALS — BP 130/60 | HR 87 | Temp 98.3°F | Ht 68.0 in | Wt 295.6 lb

## 2021-08-27 DIAGNOSIS — G4700 Insomnia, unspecified: Secondary | ICD-10-CM | POA: Diagnosis not present

## 2021-08-27 DIAGNOSIS — J4550 Severe persistent asthma, uncomplicated: Secondary | ICD-10-CM

## 2021-08-27 DIAGNOSIS — Z6841 Body Mass Index (BMI) 40.0 and over, adult: Secondary | ICD-10-CM

## 2021-08-27 DIAGNOSIS — G4733 Obstructive sleep apnea (adult) (pediatric): Secondary | ICD-10-CM | POA: Diagnosis not present

## 2021-08-27 MED ORDER — BREZTRI AEROSPHERE 160-9-4.8 MCG/ACT IN AERO: 2.0000 | INHALATION_SPRAY | Freq: Two times a day (BID) | RESPIRATORY_TRACT | 11 refills | Status: DC

## 2021-08-27 MED ORDER — ESZOPICLONE 2 MG PO TABS: 2.0000 mg | ORAL_TABLET | Freq: Every evening | ORAL | 0 refills | Status: AC | PRN

## 2021-08-27 MED ORDER — IPRATROPIUM-ALBUTEROL 0.5-2.5 (3) MG/3ML IN SOLN: 3.0000 mL | Freq: Four times a day (QID) | RESPIRATORY_TRACT | 2 refills | Status: AC | PRN

## 2021-08-27 MED ORDER — PREDNISONE 10 MG PO TABS: ORAL_TABLET | ORAL | 0 refills | Status: DC

## 2021-08-27 MED ORDER — ALBUTEROL SULFATE HFA 108 (90 BASE) MCG/ACT IN AERS: 2.0000 | INHALATION_SPRAY | Freq: Four times a day (QID) | RESPIRATORY_TRACT | 6 refills | Status: AC | PRN

## 2021-08-27 NOTE — Assessment & Plan Note (Signed)
Slow to resolve asthmatic exacerbation with allergic component. ?We will treat with a prednisone course.  However if continues to have ongoing flares I requiring frequent steroid usage we will need to look at expanding maintenance regimen with possible Biologics. ?Continue on Breztri, Singulair.  Add in Zyrtec daily. ?Trigger prevention. ? ?Plan  ?Patient Instructions  ?Prednisone taper over next week .  ?Begin Zyrtec 10mg  At bedtime  As needed  drainage.  ?Continue on BREZTRI 2 puffs Twice daily  , rinse after use.  ?Albuterol or Duoneb As needed   ?Chest xray today.  ? ?Continue on CPAP At bedtime   ?Wear all night long .  ?Do not drive if sleepy  ?Work on healthy weight loss.  ? ?Lunesta At bedtime  As needed  insomnia, use with caution  ? ?Follow up with Dr. or Aaleyah Witherow NP in 3 months and As needed   ?Please contact office for sooner follow up if symptoms do not improve or worsen or seek emergency care  ? ? ?  ? ?

## 2021-08-27 NOTE — Progress Notes (Signed)
? ?@Patient  ID: Joseph [name], male    DOB: 07-Sep-1980, 41 y.o.   MRN: [id number] ? ?Chief Complaint  ?Patient presents with  ? Follow-up  ? ? ?Referring provider: ?Hulan Fess, MD ? ?HPI: ?41 year old male former smoker seen for initial consult March 2021 during hospitalization for acute on chronic hypercarbic and hypoxic respiratory failure in the setting of OSA/OHS.  He is followed for severe persistent asthma ? ?TEST/EVENTS :  ?Hospitalization March 2021 with acute on chronic hypercarbic and hypoxic respiratory failure required ICU admission with BiPAP, on admission hypercarbic with PCO2 at 72, CT chest showed cardiomegaly and a small pleural effusion, felt secondary to asthma exacerbation with decompensated cor pulmonale likely undiagnosed OSA and OHS. He was treated with IV antibiotics, steroids, BiPAP and diuresis for decompensated heart failure ?  ?2D echo August 17, 2019 EF 60-65%, normal RV systolic function and RV size, ?CT chest August 19, 2019 minimal bilateral pleural effusions, dependent atelectasis in the right base, fatty infiltration of liver. ?  ? sleep study Sep 30, 2019 moderate obstructive sleep apnea, moderate oxygen desaturation and loud snoring. Significant periodic leg movements during sleep ? ? pulmonary function testing that shows severe airflow obstruction with an FEV1 at 47%, ratio 70, FVC 55%, significant bronchodilator response and a DLCO at 138%. ? ?Asthma w/up  ?No known premature birth. Started smoking age 84 till 78 1/2-1 PPD x 16 yr.  ?Work Proofreader . 1 cat /dog . No basement . No hottub , no unusual hobbies.  ?No GERD or sinus issue . No rash/eczema.  ?Lab 07/2019 - allergy profile -+++dust , IgE increase 310.  ? ?08/27/2021 Follow up : Asthma and OSA  ?Patient presents for a 1 year follow-up.  Patient has underlying severe persistent asthma.  Patient says he was doing well until about a month ago.  He started having some increased dry cough and wheezing.  Patient was seen  initially by his primary care provider given prednisone which worked for short period of time and then symptoms over the last 2 weeks have started to come back.  He relates this to the increased pollen outside.  Patient denies any discolored mucus, fever, chest pain.  He denies any GERD symptoms.  No increased leg swelling.  Patient says he is active.  Works full-time in Teacher, adult education.  Says there is a lot of dust also in his workplace. ?Patient does not have increased sinus drainage.  He is on Singulair daily.  He has had increased albuterol use. ? ?Patient has underlying sleep apnea.  Patient says he is doing well on his CPAP.  He wears his CPAP every single night.  Feels rested with no significant daytime sleepiness.  He uses a fullface mask.  Uses [employer].  Current weight is 295 pounds.  We discussed healthy weight loss.  CPAP download shows excellent compliance with 97% usage.  Daily average usage at 6 hours.  AHI 2.8.  Patient is on CPAP 11 cm H2O. ? ?Patient does use Lunesta on occasion for insomnia.  We discussed the use of sleep aids and potential side effects.  Also discussed only using a sleep aid if he is wearing his CPAP all night long. ? ? ?No Known Allergies ? ?Immunization History  ?Administered Date(s) Administered  ? Influenza,inj,Quad PF,6+ Mos 02/14/2020  ? Influenza,inj,Quad PF,6-35 Mos 02/24/2019  ? PFIZER(Purple Top)SARS-COV-2 Vaccination 09/16/2019, 10/07/2019, 04/21/2020  ? Pension scheme manager 20yrs & up 03/22/2021  ? Td 03/18/2017  ? Tdap  12/21/2007  ? ? ?Past Medical History:  ?Diagnosis Date  ? History of 2019 novel coronavirus disease (COVID-19) 06/06/2020  ? per pt had positive coivd test done at [facility] in [place] w/ [place] [facility],  had mild symptoms and exacerbation of asthma,  all resolved per pt (pt pulmonology provider states in office note in epic 07-11-2020 dx 06-06-2020)  ? History of acute respiratory failure 08/17/2019  ? hypoxic and hypercapnic,  admission in epic  ? History of cor pulmonale 08/17/2019  ? Hypertension   ? OSA on CPAP   ? followed by dr a. Ander Slade---  moderate osa per study 09-30-2019 in epic  ? Right knee meniscal tear   ? Severe persistent asthma   ? pulmonology---- dr a. Ander Slade  ? Wears glasses   ? Wears hearing aid in both ears   ? ? ?Tobacco History: ?Social History  ? ?Tobacco Use  ?Smoking Status Former  ? Packs/day: 1.00  ? Years: 8.00  ? Pack years: 8.00  ? Types: Cigarettes  ? Quit date: 05/26/2009  ? Years since quitting: 12.2  ?Smokeless Tobacco Never  ? ?Counseling given: Not Answered ? ? ?Outpatient Medications Prior to Visit  ?Medication Sig Dispense Refill  ? amLODipine (NORVASC) 10 MG tablet Take 10 mg by mouth daily.    ? aspirin EC 81 MG EC tablet Take 1 tablet (81 mg total) by mouth daily. 30 tablet 0  ? furosemide (LASIX) 20 MG tablet Take 1 tablet (20 mg total) by mouth daily. 30 tablet 0  ? HYDROcodone-acetaminophen (NORCO) 7.5-325 MG tablet Take 1 tablet by mouth every 4 (four) hours as needed for moderate pain. 30 tablet 0  ? montelukast (SINGULAIR) 10 MG tablet TAKE ONE TABLET BY MOUTH EVERY NIGHT AT BEDTIME 30 tablet 5  ? ondansetron (ZOFRAN ODT) 4 MG disintegrating tablet Take 1 tablet (4 mg total) by mouth every 8 (eight) hours as needed for nausea or vomiting. 20 tablet 0  ? albuterol (VENTOLIN HFA) 108 (90 Base) MCG/ACT inhaler Inhale 2 puffs into the lungs every 6 (six) hours as needed for wheezing or shortness of breath.    ? Budeson-Glycopyrrol-Formoterol (BREZTRI AEROSPHERE) 160-9-4.8 MCG/ACT AERO Inhale 2 puffs into the lungs 2 (two) times daily. 1 g 11  ? eszopiclone (LUNESTA) 2 MG TABS tablet TAKE ONE TABLET BY MOUTH EVERY NIGHT AT BEDTIME AS NEEDED FOR SLEEP 30 tablet 3  ? ipratropium-albuterol (DUONEB) 0.5-2.5 (3) MG/3ML SOLN Take 3 mLs by nebulization every 6 (six) hours as needed. 360 mL 0  ? ?No facility-administered medications prior to visit.  ? ? ? ?Review of Systems:  ? ?Constitutional:   No   weight loss, night sweats,  Fevers, chills, fatigue, or  lassitude. ? ?HEENT:   No headaches,  Difficulty swallowing,  Tooth/dental problems, or  Sore throat,  ?              No sneezing, itching, ear ache, nasal congestion, post nasal drip,  ? ?CV:  No chest pain,  Orthopnea, PND, swelling in lower extremities, anasarca, dizziness, palpitations, syncope.  ? ?GI  No heartburn, indigestion, abdominal pain, nausea, vomiting, diarrhea, change in bowel habits, loss of appetite, bloody stools.  ? ?Resp: No shortness of breath with exertion or at rest.  No excess mucus, no productive cough,  No non-productive cough,  No coughing up of blood.  No change in color of mucus.  No wheezing.  No chest wall deformity ? ?Skin: no rash or lesions. ? ?GU:  no dysuria, change in color of urine, no urgency or frequency.  No flank pain, no hematuria  ? ?MS:  No joint pain or swelling.  No decreased range of motion.  No back pain. ? ? ? ?Physical Exam ? ?BP 130/60 (BP Location: Left Arm, Patient Position: Sitting, Cuff Size: Large)   Pulse 87   Temp 98.3 ?F (36.8 ?C) (Oral)   Ht 5\' 8"  (1.727 m)   Wt 295 lb 9.6 oz (134.1 kg)   SpO2 96%   BMI 44.95 kg/m?  ? ?GEN: A/Ox3; pleasant , NAD, well nourished  ?  ?HEENT:  [place]/AT,  NOSE-clear, THROAT-clear, no lesions, no postnasal drip or exudate noted.  ? ?NECK:  Supple w/ fair ROM; no JVD; normal carotid impulses w/o bruits; no thyromegaly or nodules palpated; no lymphadenopathy.   ? ?RESP  few expiratory wheezing , speaks in full sentences . no accessory muscle use, no dullness to percussion ? ?CARD:  RRR, no m/r/g, no peripheral edema, pulses intact, no cyanosis or clubbing. ? ?GI:   Soft & nt; nml bowel sounds; no organomegaly or masses detected.  ? ?Musco: Warm bil, no deformities or joint swelling noted.  ? ?Neuro: alert, no focal deficits noted.   ? ?Skin: Warm, no lesions or rashes ? ? ? ?Lab Results: ? ?CBC ? ? ? ?ProBNP ?No results found for: PROBNP ? ?Imaging: ?No results  found. ? ? ? ? ?  Latest Ref Rng & Units 02/14/2020  ?  8:54 AM  ?PFT Results  ?FVC-Pre L 2.39    ?FVC-Predicted Pre % 49    ?FVC-Post L 2.66    ?FVC-Predicted Post % 55    ?Pre FEV1/FVC % % 66    ?Post FEV1/FCV % % 70    ?FEV1-P

## 2021-08-27 NOTE — Patient Instructions (Addendum)
Prednisone taper over next week .  ?Begin Zyrtec 10mg  At bedtime  As needed  drainage.  ?Continue on BREZTRI 2 puffs Twice daily  , rinse after use.  ?Albuterol or Duoneb As needed   ?Chest xray today.  ? ?Continue on CPAP At bedtime   ?Wear all night long .  ?Do not drive if sleepy  ?Work on healthy weight loss.  ? ?Lunesta At bedtime  As needed  insomnia, use with caution  ? ?Follow up with Dr. or Lylee Corrow NP in 3 months and As needed   ?Please contact office for sooner follow up if symptoms do not improve or worsen or seek emergency care  ? ? ?

## 2021-08-27 NOTE — Assessment & Plan Note (Signed)
Healthy sleep regimen discussed.  May use Lunesta as needed.  Patient education on sleep aids. ? ?Plan  ?Patient Instructions  ?Prednisone taper over next week .  ?Begin Zyrtec 10mg  At bedtime  As needed  drainage.  ?Continue on BREZTRI 2 puffs Twice daily  , rinse after use.  ?Albuterol or Duoneb As needed   ?Chest xray today.  ? ?Continue on CPAP At bedtime   ?Wear all night long .  ?Do not drive if sleepy  ?Work on healthy weight loss.  ? ?Lunesta At bedtime  As needed  insomnia, use with caution  ? ?Follow up with Dr. or Natash Berman NP in 3 months and As needed   ?Please contact office for sooner follow up if symptoms do not improve or worsen or seek emergency care  ? ? ?  ? ?

## 2021-08-27 NOTE — Assessment & Plan Note (Signed)
Current weight 295 pounds BMI 44.  Patient is encouraged on healthy weight loss. ?

## 2021-08-27 NOTE — Assessment & Plan Note (Signed)
Patient has good control compliance on CPAP.  Encourage patient to wear his CPAP all night long. ?CPAP maintenance care discussed. ?Healthy weight loss discussed. ? ?Plan  ?Patient Instructions  ?Prednisone taper over next week .  ?Begin Zyrtec 10mg  At bedtime  As needed  drainage.  ?Continue on BREZTRI 2 puffs Twice daily  , rinse after use.  ?Albuterol or Duoneb As needed   ?Chest xray today.  ? ?Continue on CPAP At bedtime   ?Wear all night long .  ?Do not drive if sleepy  ?Work on healthy weight loss.  ? ?Lunesta At bedtime  As needed  insomnia, use with caution  ? ?Follow up with Dr. or Mamadou Breon NP in 3 months and As needed   ?Please contact office for sooner follow up if symptoms do not improve or worsen or seek emergency care  ? ? ?  ? ?

## 2021-09-02 ENCOUNTER — Other Ambulatory Visit: Payer: Self-pay | Admitting: Adult Health

## 2021-12-03 ENCOUNTER — Ambulatory Visit: Payer: Managed Care, Other (non HMO) | Admitting: Adult Health

## 2021-12-12 ENCOUNTER — Ambulatory Visit: Payer: Managed Care, Other (non HMO) | Admitting: Adult Health

## 2022-03-18 ENCOUNTER — Telehealth: Payer: Self-pay | Admitting: Adult Health

## 2022-03-18 NOTE — Telephone Encounter (Signed)
PMP shows Joseph [name] was filled by another prescriber on 02/24/22.  He is overdue for follow up with Dr. Ander Slade will need follow up before any additional refills are sent .

## 2022-03-18 NOTE — Telephone Encounter (Signed)
Attempted to call pt but unable to reach. Left message for him to return call. °

## 2022-06-21 IMAGING — DX DG CHEST 2V
2 series · 2 of 2 positions shown · non-contrast
Comparison: January 24, 2020

CLINICAL DATA: Wheezing.  Recent [id number] positive

EXAM:
CHEST - 2 VIEW

[chest pa]
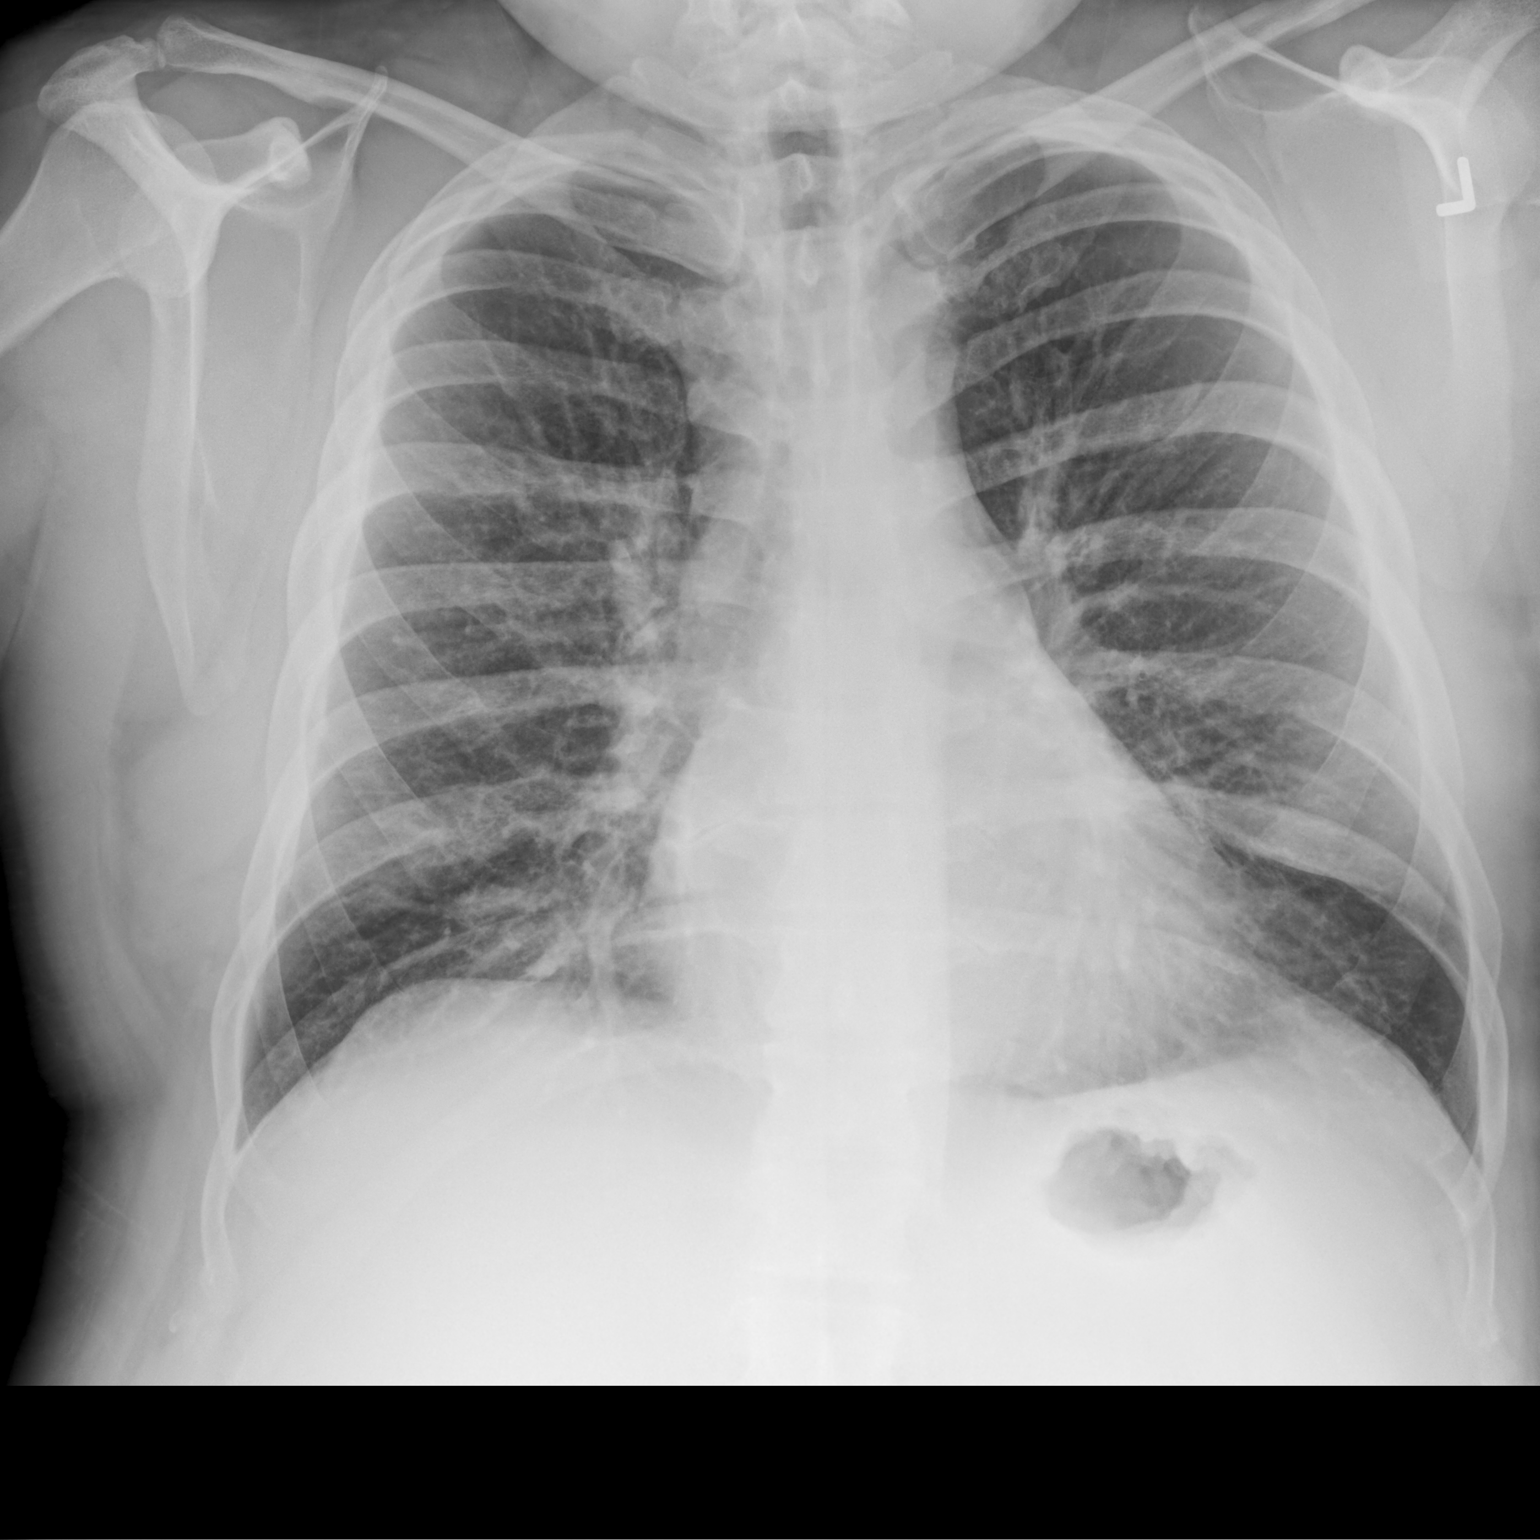

[chest lat]
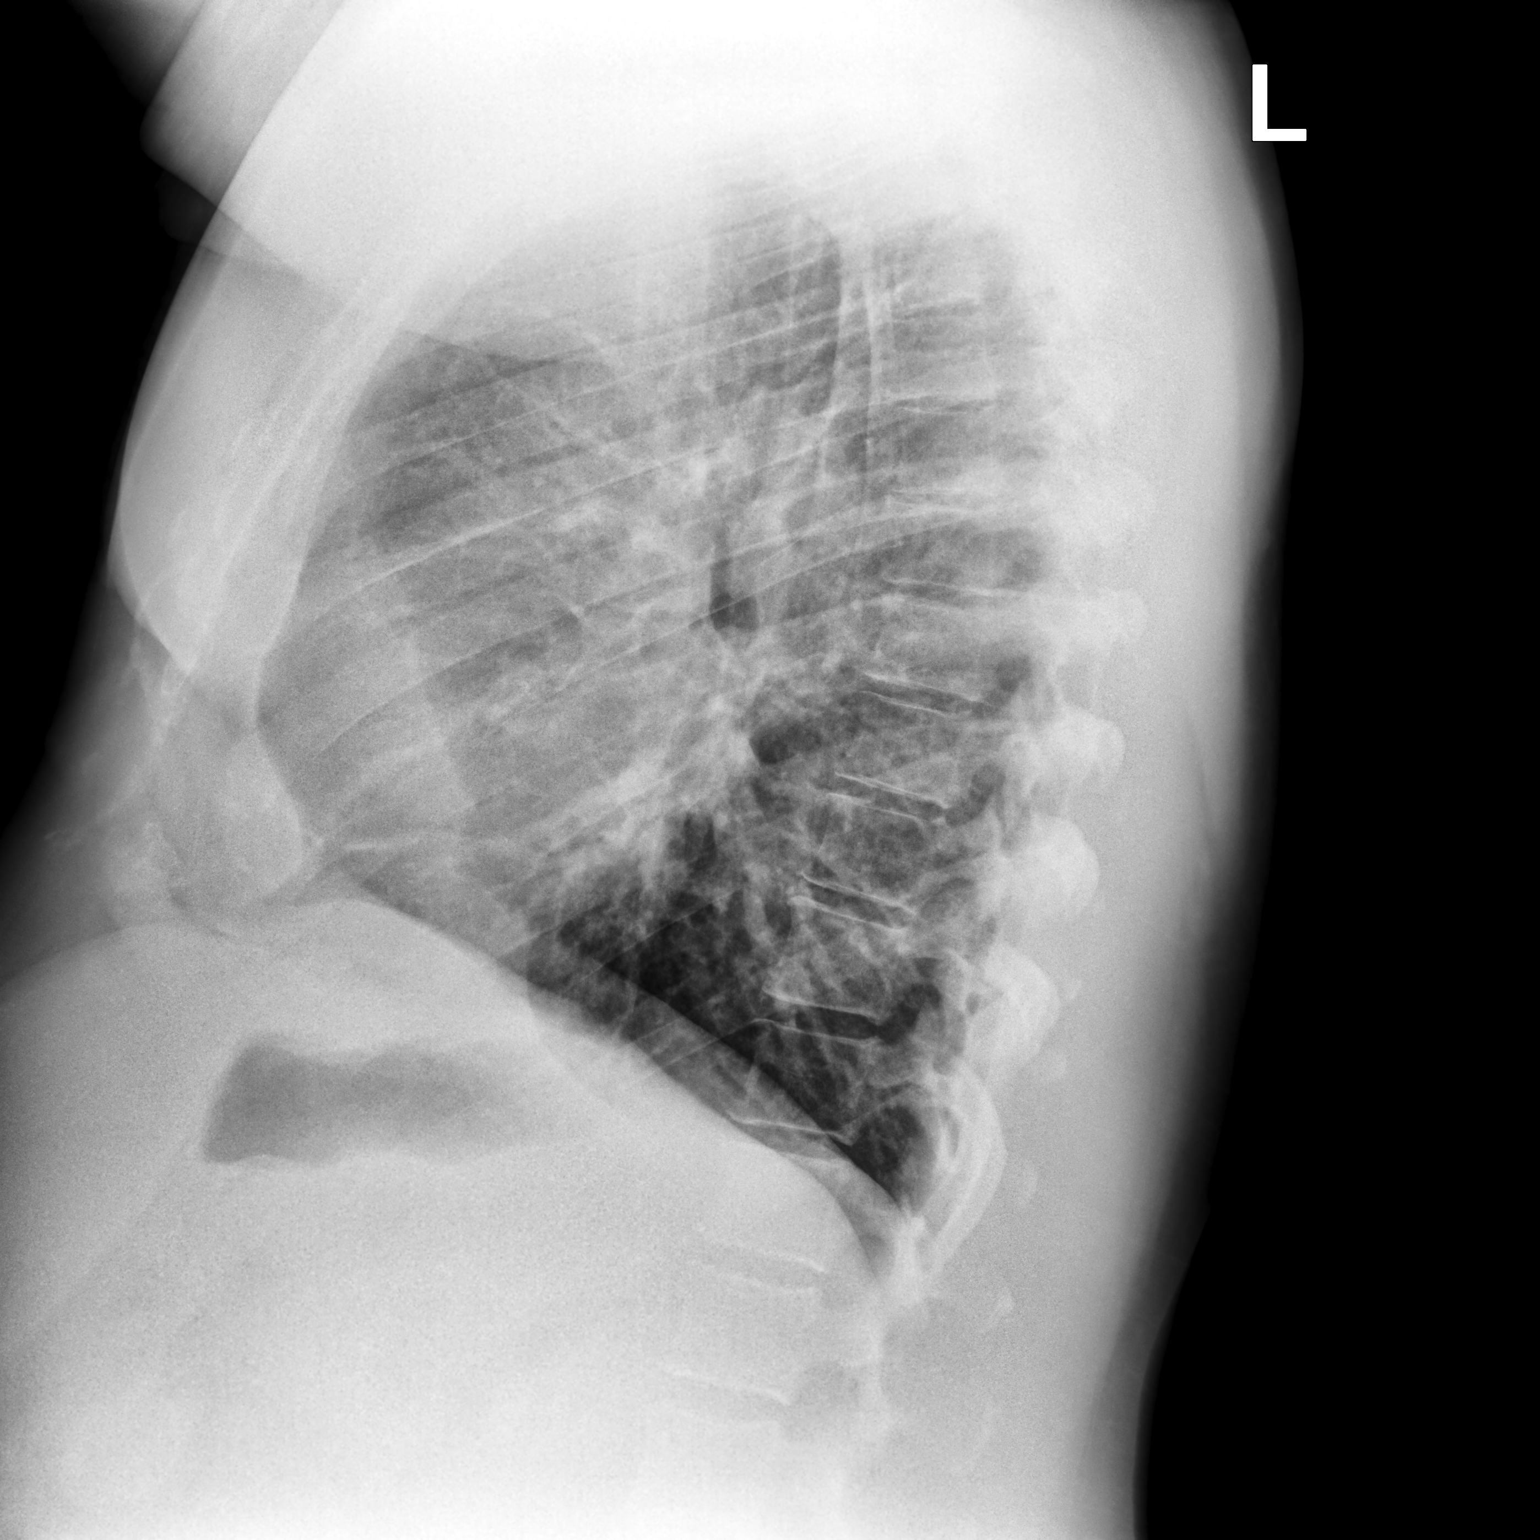

[2 of 2 positions shown; findings below may reference images not displayed]

FINDINGS: The lungs are clear. The heart size and pulmonary vascularity are
normal. No adenopathy. No bone lesions.
IMPRESSION: Lungs clear.  Cardiac silhouette normal.

## 2022-09-01 ENCOUNTER — Other Ambulatory Visit: Payer: Self-pay | Admitting: Adult Health

## 2022-09-05 NOTE — Telephone Encounter (Signed)
Any future refills will require a appointment  

## 2022-10-05 ENCOUNTER — Other Ambulatory Visit: Payer: Self-pay | Admitting: Adult Health

## 2022-10-31 ENCOUNTER — Other Ambulatory Visit: Payer: Self-pay | Admitting: Adult Health

## 2022-11-22 ENCOUNTER — Other Ambulatory Visit: Payer: Self-pay | Admitting: Adult Health

## 2022-11-25 NOTE — Telephone Encounter (Signed)
Needs f/u for further refills

## 2023-08-07 IMAGING — DX DG CHEST 2V
2 series · 2 of 2 positions shown · non-contrast
Comparison: 07/11/2020

CLINICAL DATA: 40-year-old male with a history asthma

EXAM:
CHEST - 2 VIEW

[chest pa]
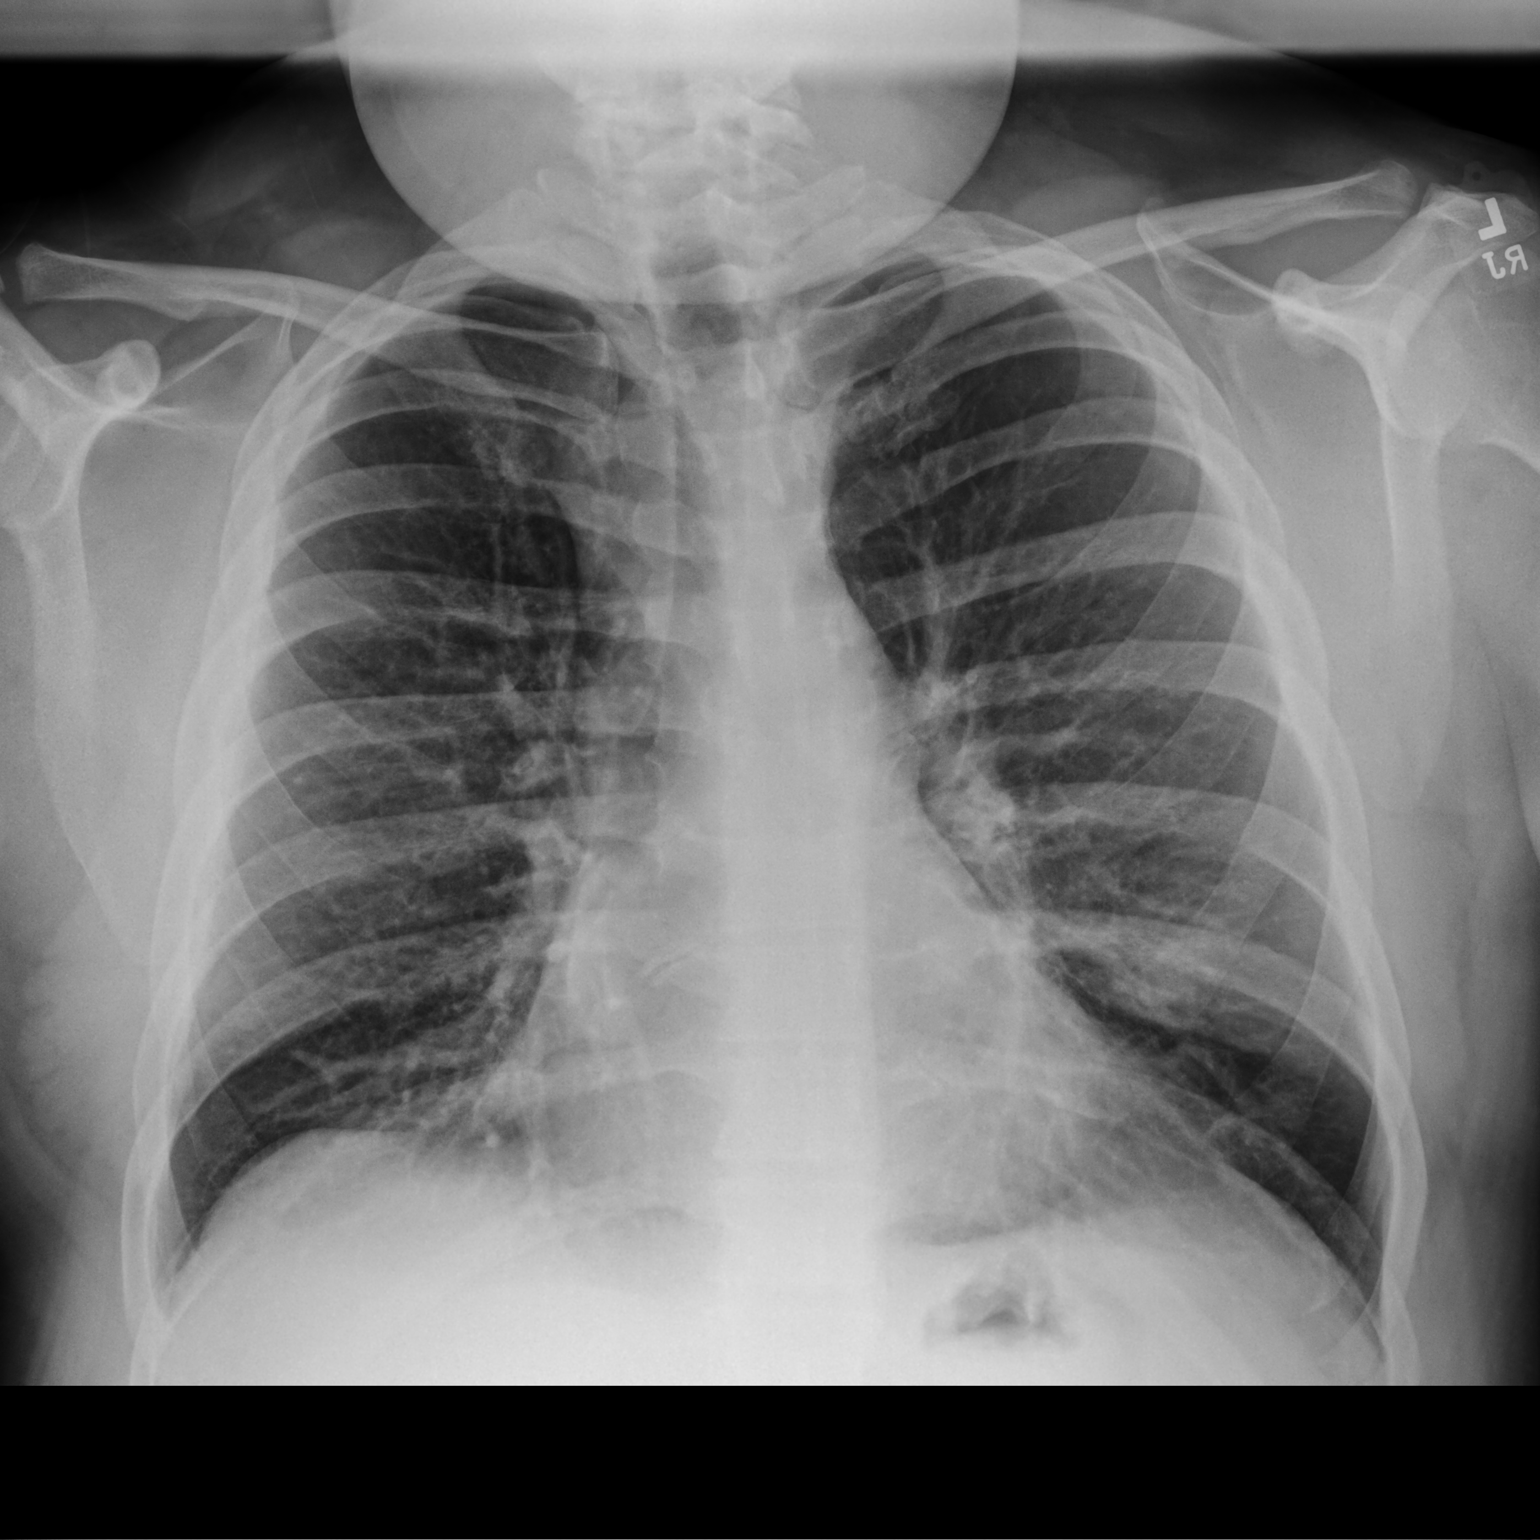

[chest lat]
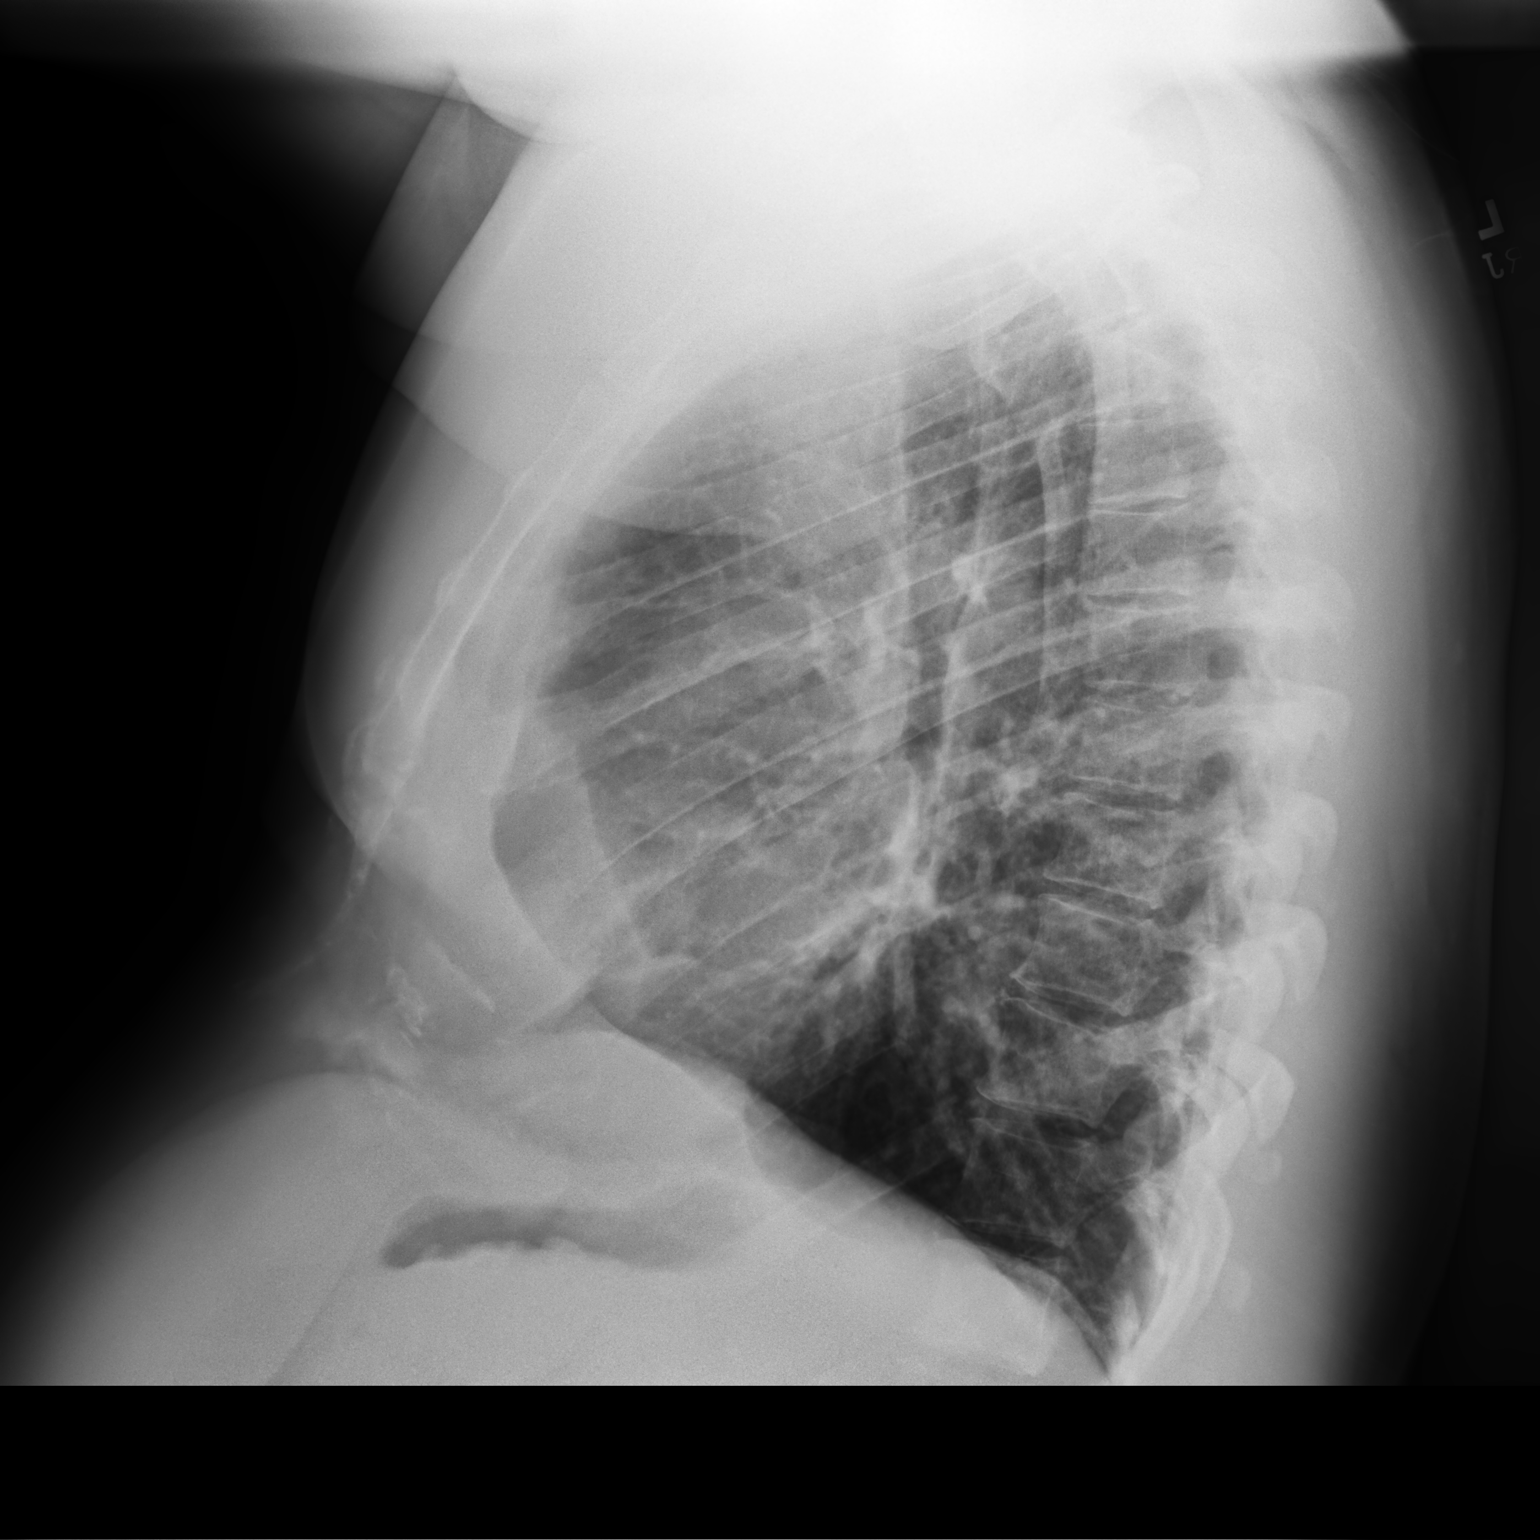

[2 of 2 positions shown; findings below may reference images not displayed]

FINDINGS: Cardiomediastinal silhouette unchanged in size and contour. No
evidence of central vascular congestion. No interlobular septal
thickening. No pneumothorax or pleural effusion. No confluent
airspace disease.

No acute displaced fracture
IMPRESSION: No active cardiopulmonary disease.

## 2024-01-28 ENCOUNTER — Ambulatory Visit: Admitting: Adult Health

## 2024-01-28 DIAGNOSIS — G4700 Insomnia, unspecified: Secondary | ICD-10-CM

## 2024-01-28 DIAGNOSIS — J4550 Severe persistent asthma, uncomplicated: Secondary | ICD-10-CM

## 2024-01-28 DIAGNOSIS — G4733 Obstructive sleep apnea (adult) (pediatric): Secondary | ICD-10-CM

## 2024-05-29 ENCOUNTER — Ambulatory Visit: Admitting: Radiology

## 2024-05-29 ENCOUNTER — Ambulatory Visit
Admission: RE | Admit: 2024-05-29 | Discharge: 2024-05-29 | Disposition: A | Discharge status: Home / Self Care | Payer: Self-pay | Source: Ambulatory Visit | Attending: Internal Medicine | Admitting: Internal Medicine

## 2024-05-29 VITALS — BP 145/74 | HR 110 | Temp 98.6°F | Resp 20

## 2024-05-29 DIAGNOSIS — J189 Pneumonia, unspecified organism: Secondary | ICD-10-CM

## 2024-05-29 DIAGNOSIS — R062 Wheezing: Secondary | ICD-10-CM

## 2024-05-29 DIAGNOSIS — R051 Acute cough: Secondary | ICD-10-CM | POA: Diagnosis not present

## 2024-05-29 DIAGNOSIS — R0602 Shortness of breath: Secondary | ICD-10-CM

## 2024-05-29 MED ORDER — PREDNISONE 20 MG PO TABS: 40.0000 mg | ORAL_TABLET | Freq: Every day | ORAL | 0 refills | Status: AC

## 2024-05-29 MED ORDER — PROMETHAZINE-DM 6.25-15 MG/5ML PO SYRP: 5.0000 mL | ORAL_SOLUTION | Freq: Three times a day (TID) | ORAL | 0 refills | Status: AC | PRN

## 2024-05-29 MED ORDER — IPRATROPIUM-ALBUTEROL 0.5-2.5 (3) MG/3ML IN SOLN
3.0000 mL | Freq: Once | RESPIRATORY_TRACT | Status: AC
  Administered 2024-05-29: 3 mL via RESPIRATORY_TRACT

## 2024-05-29 MED ORDER — AMOXICILLIN-POT CLAVULANATE 875-125 MG PO TABS: 1.0000 | ORAL_TABLET | Freq: Two times a day (BID) | ORAL | 0 refills | Status: AC

## 2024-05-29 MED ORDER — AZITHROMYCIN 250 MG PO TABS: 250.0000 mg | ORAL_TABLET | Freq: Every day | ORAL | 0 refills | Status: DC

## 2024-05-29 NOTE — ED Triage Notes (Addendum)
 Pt c/o cough for 3 weeks. States it is worse at night and his heart rate has been running high. He did an E visit and was given prednisone  and inhaler which hasn't helped

## 2024-05-29 NOTE — ED Provider Notes (Signed)
 " [name]    CSN: [id number] Arrival date & time: 05/29/24  1023      History   Chief Complaint Chief Complaint  Patient presents with   Cough    Cough has been lasting almost three weeks I have tested for Covid and it's negative sometimes after [name] big coughing fit blood is in the mucus - Entered by patient    HPI Joseph [name] is [name] 44 y.o. male.   44 year old male who presents urgent care with complaints of cough, shortness of breath, wheezing and increased heart rate.  His cough is productive.  His cough is worse at night.  He did an e-visit and was given an albuterol  inhaler as well as prednisone  but he has not gotten any better.  He does have [name] nebulizer at home and that his last nebulizing treatment was 2 days ago.  He has asthma and uses [name] daily inhaler.  He denies fevers, nausea, vomiting.  He does report generalized weakness associated with this.   Cough Associated symptoms: shortness of breath and wheezing   Associated symptoms: no chest pain, no chills, no ear pain, no fever, no rash and no sore throat     Past Medical History:  Diagnosis Date   History of 2019 novel coronavirus disease (COVID-19) 06/06/2020   per pt had positive coivd test done at [facility] in [place] w/ [place] [facility],  had mild symptoms and exacerbation of asthma,  all resolved per pt (pt pulmonology provider states in office note in epic 07-11-2020 dx 06-06-2020)   History of acute respiratory failure 08/17/2019   hypoxic and hypercapnic, admission in epic   History of cor pulmonale 08/17/2019   Hypertension    OSA on CPAP    followed by dr [name]. neda---  moderate osa per study 09-30-2019 in epic   Right knee meniscal tear    Severe persistent asthma [facility])    pulmonology---- dr [name]. neda   Wears glasses    Wears hearing aid in both ears     Patient Active Problem List   Diagnosis Date Noted   Insomnia 08/27/2021   COVID-19 07/11/2020   Severe persistent asthma (HCC)  02/14/2020   OSA (obstructive sleep apnea) 10/18/2019   Acute on chronic respiratory failure with hypoxia and hypercapnia (HCC)    Acute respiratory failure with hypoxia (HCC) 08/17/2019   Obesity 08/17/2019   Essential hypertension 08/17/2019    Past Surgical History:  Procedure Laterality Date   KNEE ARTHROSCOPY WITH MEDIAL MENISECTOMY Right 08/28/2020   Procedure: KNEE ARTHROSCOPY WITH PARTIAL MEDIAL MENISECTOMY;  Surgeon: Joseph Selinda Dover, MD;  Location: [facility];  Service: Orthopedics;  Laterality: Right;   NO PAST SURGERIES     WISDOM TOOTH EXTRACTION  age 78       Home Medications    Prior to Admission medications  Medication Sig Start Date End Date Taking? Authorizing Provider  amoxicillin -clavulanate (AUGMENTIN ) 875-125 MG tablet Take 1 tablet by mouth every 12 (twelve) hours for 5 days. 05/29/24 06/03/24 Yes Joseph [name], Joseph [name]  azithromycin  (ZITHROMAX ) 250 MG tablet Take 1 tablet (250 mg total) by mouth daily. Take first 2 tablets together, then 1 every day until finished. 05/29/24  Yes Joseph [name], Joseph [name]  predniSONE  (DELTASONE ) 20 MG tablet Take 2 tablets (40 mg total) by mouth daily with breakfast for 5 days. 05/29/24 06/03/24 Yes Joseph [name], Joseph [name]  promethazine -dextromethorphan (PROMETHAZINE -DM) 6.25-15 MG/5ML syrup Take 5 mLs by mouth every  8 (eight) hours as needed for cough. 05/29/24  Yes Joseph [name], Joseph [name]  albuterol  (VENTOLIN  HFA) 108 (90 Base) MCG/ACT inhaler Inhale 2 puffs into the lungs every 6 (six) hours as needed for wheezing or shortness of breath. 08/27/21   [name], Joseph RAMAN, NP  amLODipine  (NORVASC ) 10 MG tablet Take 10 mg by mouth daily. 08/11/19   [provider]  aspirin  EC 81 MG EC tablet Take 1 tablet (81 mg total) by mouth daily. 08/23/19   [name], Joseph A, MD  BREZTRI  AEROSPHERE 160-9-4.8 MCG/ACT AERO INHALE 2 PUFFS BY MOUTH TWICE [name] DAY 10/06/22   [name], Joseph S, NP  eszopiclone  (LUNESTA ) 2 MG TABS tablet Take  1 tablet (2 mg total) by mouth at bedtime as needed for sleep. Take immediately before bedtime 08/27/21   [name], Joseph RAMAN, NP  furosemide  (LASIX ) 20 MG tablet Take 1 tablet (20 mg total) by mouth daily. 08/23/19   [name], Joseph A, MD  HYDROcodone -acetaminophen  (NORCO) 7.5-325 MG tablet Take 1 tablet by mouth every 4 (four) hours as needed for moderate pain. 08/28/20   Joseph Selinda Dover, MD  ipratropium-albuterol  (DUONEB) 0.5-2.5 (3) MG/3ML SOLN Take 3 mLs by nebulization every 6 (six) hours as needed. 08/27/21   [name], Joseph RAMAN, NP  montelukast  (SINGULAIR ) 10 MG tablet TAKE ONE TABLET BY MOUTH EVERY NIGHT AT BEDTIME 09/02/21   [name], Joseph S, NP  ondansetron  (ZOFRAN  ODT) 4 MG disintegrating tablet Take 1 tablet (4 mg total) by mouth every 8 (eight) hours as needed for nausea or vomiting. 08/28/20   Joseph Selinda Dover, MD    Family History Family History  Problem Relation Age of Onset   Asthma Father     Social History Social History[1]   Allergies   Patient has no known allergies.   Review of Systems Review of Systems  Constitutional:  Positive for fatigue. Negative for chills and fever.  HENT:  Negative for ear pain and sore throat.   Eyes:  Negative for pain and visual disturbance.  Respiratory:  Positive for cough, shortness of breath and wheezing.   Cardiovascular:  Negative for chest pain and palpitations.  Gastrointestinal:  Negative for abdominal pain and vomiting.  Genitourinary:  Negative for dysuria and hematuria.  Musculoskeletal:  Negative for arthralgias and back pain.  Skin:  Negative for color change and rash.  Neurological:  Negative for seizures and syncope.  All other systems reviewed and are negative.    Physical Exam Triage Vital Signs ED Triage Vitals  Encounter Vitals Group     BP 05/29/24 1034 (!) 145/74     Girls Systolic BP Percentile --      Girls Diastolic BP Percentile --      Boys Systolic BP Percentile --      Boys Diastolic BP  Percentile --      Pulse Rate 05/29/24 1034 (!) 110     Resp 05/29/24 1034 20     Temp 05/29/24 1034 98.6 F (37 C)     Temp Source 05/29/24 1034 Oral     SpO2 05/29/24 1034 91 %     Weight --      Height --      Head Circumference --      Peak Flow --      Pain Score 05/29/24 1047 0     Pain Loc --      Pain Education --      Exclude from Growth Chart --    No data found.  Updated Vital Signs BP (!) 145/74 (BP Location: Left Arm)   Pulse (!) 110   Temp 98.6 F (37 C) (Oral)   Resp 20   SpO2 91%   Visual Acuity Right Eye Distance:   Left Eye Distance:   Bilateral Distance:    Right Eye Near:   Left Eye Near:    Bilateral Near:     Physical Exam Vitals and nursing note reviewed.  Constitutional:      General: He is not in acute distress.    Appearance: He is well-developed.  HENT:     Head: Normocephalic and atraumatic.  Eyes:     Conjunctiva/sclera: Conjunctivae normal.  Cardiovascular:     Rate and Rhythm: Normal rate and regular rhythm.     Heart sounds: No murmur heard. Pulmonary:     Effort: Pulmonary effort is normal. No tachypnea or respiratory distress.     Breath sounds: Examination of the right-upper field reveals wheezing and rhonchi. Examination of the left-upper field reveals wheezing. Examination of the right-middle field reveals wheezing and rhonchi. Examination of the left-middle field reveals wheezing. Examination of the right-lower field reveals decreased breath sounds, wheezing and rhonchi. Examination of the left-lower field reveals decreased breath sounds and wheezing. Decreased breath sounds, wheezing and rhonchi present.  Abdominal:     Palpations: Abdomen is soft.     Tenderness: There is no abdominal tenderness.  Musculoskeletal:        General: No swelling.     Cervical back: Neck supple.  Skin:    General: Skin is warm and dry.     Capillary Refill: Capillary refill takes less than 2 seconds.  Neurological:     Mental Status: He is  alert.  Psychiatric:        Mood and Affect: Mood normal.      UC Treatments / Results  Labs (all labs ordered are listed, but only abnormal results are displayed) Labs Reviewed - No data to display  EKG   Radiology DG Chest 2 View Result Date: 05/29/2024 CLINICAL DATA:  Shortness of breath and cough for 3 weeks EXAM: CHEST - 2 VIEW COMPARISON:  August 27, 2021 FINDINGS: The heart size and mediastinal contours are within normal limits. Left lung is clear. Large rounded but ill-defined density is noted in right upper lobe which may represent pneumonia, but neoplasm cannot be excluded. The visualized skeletal structures are unremarkable. IMPRESSION: Large rounded but ill-defined density seen in right upper lobe which may represent pneumonia, but neoplasm cannot be excluded. CT scan of the chest with intravenous contrast is recommended for further evaluation. Electronically Signed   By: Lynwood Landy Raddle M.D.   On: 05/29/2024 11:40    Procedures Procedures (including critical care time)  Medications Ordered in UC Medications  ipratropium-albuterol  (DUONEB) 0.5-2.5 (3) MG/3ML nebulizer solution 3 mL (3 mLs Nebulization Given 05/29/24 1058)    Initial Impression / Assessment and Plan / UC Course  I have reviewed the triage vital signs and the nursing notes.  Pertinent labs & imaging results that were available during my care of the patient were reviewed by me and considered in my medical decision making (see chart for details).     Pneumonia of right upper lobe due to infectious organism  Shortness of breath - Plan: DG Chest 2 View, DG Chest 2 View  Acute cough - Plan: DG Chest 2 View, DG Chest 2 View  Wheezing - Plan: DG Chest 2 View, DG Chest 2 View  Chest x-ray done today.  Final evaluation by the radiologist does show an ill-defined area in the right upper lobe likely representing pneumonia however this will need to be rechecked in about 2 weeks to ensure complete resolution.  We  recommend the following: DuoNeb breathing treatment done in clinic today. Augmentin  875 mg twice daily for 5 days.  This is an antibiotic.  Take this with food.  Azithromycin  250mg  Take 2 tablets today and the 1 tablet daily for 4 more days.  Prednisone  40 mg (2 tablets) once daily for 5 days. Take this in the morning.  This is [name] steroid to help with inflammation Promethazine  DM 5 mL every 8 hours as needed for cough.  Use caution as this medication can cause drowsiness.  Make sure to stay hydrated by drinking plenty of water. Recommend returning to urgent care for repeat chest x-ray in 2 weeks Return to urgent care or PCP if symptoms worsen or fail to resolve.    Final Clinical Impressions(s) / UC Diagnoses   Final diagnoses:  Shortness of breath  Acute cough  Wheezing  Pneumonia of right upper lobe due to infectious organism     Discharge Instructions      Chest x-ray done today.  Final evaluation by the radiologist does show an ill-defined area in the right upper lobe likely representing pneumonia however this will need to be rechecked in about 2 weeks to ensure complete resolution.  We recommend the following: DuoNeb breathing treatment done in clinic today. Augmentin  875 mg twice daily for 5 days.  This is an antibiotic.  Take this with food.  Azithromycin  250mg  Take 2 tablets today and the 1 tablet daily for 4 more days.  Prednisone  40 mg (2 tablets) once daily for 5 days. Take this in the morning.  This is [name] steroid to help with inflammation Promethazine  DM 5 mL every 8 hours as needed for cough.  Use caution as this medication can cause drowsiness.  Make sure to stay hydrated by drinking plenty of water. Recommend returning to urgent care for repeat chest x-ray in 2 weeks Return to urgent care or PCP if symptoms worsen or fail to resolve.       ED Prescriptions     Medication Sig Dispense Auth. Provider   amoxicillin -clavulanate (AUGMENTIN ) 875-125 MG tablet Take 1  tablet by mouth every 12 (twelve) hours for 5 days. 10 tablet Joseph [name] [name], Joseph [name]   azithromycin  (ZITHROMAX ) 250 MG tablet Take 1 tablet (250 mg total) by mouth daily. Take first 2 tablets together, then 1 every day until finished. 6 tablet Joseph [name] [name], Joseph [name]   predniSONE  (DELTASONE ) 20 MG tablet Take 2 tablets (40 mg total) by mouth daily with breakfast for 5 days. 10 tablet Joseph Zazueta [name], Joseph [name]   promethazine -dextromethorphan (PROMETHAZINE -DM) 6.25-15 MG/5ML syrup Take 5 mLs by mouth every 8 (eight) hours as needed for cough. 180 mL Joseph [name], Joseph [name]      PDMP not reviewed this encounter.     [1]  Social History Tobacco Use   Smoking status: Former    Current packs/day: 0.00    Average packs/day: 1 pack/day for 8.0 years (8.0 ttl pk-yrs)    Types: Cigarettes    Start date: 05/26/2001    Quit date: 05/26/2009    Years since quitting: 15.0   Smokeless tobacco: Never  Vaping Use   Vaping status: Never Used  Substance Use Topics   Alcohol use: Yes   Drug use: Never  Joseph [name], Joseph [name] 05/29/24 1148  "

## 2024-05-29 NOTE — Discharge Instructions (Addendum)
 Chest x-ray done today.  Final evaluation by the radiologist does show an ill-defined area in the right upper lobe likely representing pneumonia however this will need to be rechecked in about 2 weeks to ensure complete resolution.  We recommend the following: DuoNeb breathing treatment done in clinic today. Augmentin  875 mg twice daily for 5 days.  This is an antibiotic.  Take this with food.  Azithromycin  250mg  Take 2 tablets today and the 1 tablet daily for 4 more days.  Prednisone  40 mg (2 tablets) once daily for 5 days. Take this in the morning.  This is a steroid to help with inflammation Promethazine  DM 5 mL every 8 hours as needed for cough.  Use caution as this medication can cause drowsiness.  Make sure to stay hydrated by drinking plenty of water. Recommend returning to urgent care for repeat chest x-ray in 2 weeks Return to urgent care or PCP if symptoms worsen or fail to resolve.

## 2024-06-23 ENCOUNTER — Ambulatory Visit
Admission: RE | Admit: 2024-06-23 | Discharge: 2024-06-23 | Disposition: A | Discharge status: Home / Self Care | Attending: Physician Assistant | Admitting: Physician Assistant

## 2024-06-23 ENCOUNTER — Ambulatory Visit: Payer: Self-pay | Admitting: Physician Assistant

## 2024-06-23 ENCOUNTER — Ambulatory Visit (INDEPENDENT_AMBULATORY_CARE_PROVIDER_SITE_OTHER): Admitting: Radiology

## 2024-06-23 ENCOUNTER — Other Ambulatory Visit: Payer: Self-pay

## 2024-06-23 VITALS — BP 131/78 | HR 105 | Temp 98.3°F | Resp 18

## 2024-06-23 DIAGNOSIS — Z09 Encounter for follow-up examination after completed treatment for conditions other than malignant neoplasm: Secondary | ICD-10-CM

## 2024-06-23 DIAGNOSIS — J189 Pneumonia, unspecified organism: Secondary | ICD-10-CM

## 2024-06-23 MED ORDER — AZITHROMYCIN 250 MG PO TABS: ORAL_TABLET | ORAL | 0 refills | Status: AC

## 2024-06-23 MED ORDER — AMOXICILLIN-POT CLAVULANATE 875-125 MG PO TABS: 1.0000 | ORAL_TABLET | Freq: Two times a day (BID) | ORAL | 0 refills | Status: AC

## 2024-06-23 NOTE — ED Notes (Signed)
 Pt states he takes losartan. Unsure of the dosage. Will make PA on staff aware.

## 2024-06-23 NOTE — Progress Notes (Signed)
 Chest x-ray shows a decreased upper lobe consolidation consistent with resolving pneumonia.  This is similar to what was discussed during his visit.  He should continue with management plan as discussed and detailed in after visit summary.

## 2024-06-23 NOTE — ED Provider Notes (Signed)
 " Joseph [name]    CSN: [id number] Arrival date & time: 06/23/24  1359      History   Chief Complaint Chief Complaint  Patient presents with   Follow-up    Follow up appointment was told needed to come back for an follow up xray on chest - Entered by patient    HPI Joseph [name] is a 44 y.o. male.   HPI  Pt is here today for follow up CXR after he was diagnosed and treated for pneumonia on 05/29/24. He reports that his symptoms have resolved and he denies SOB, fever, chills, wheezing.He reports some mild coughing. He reports having a prior hx of lung issues but has not followed up with pulmonology in years.  Past Medical History:  Diagnosis Date   History of 2019 novel coronavirus disease (COVID-19) 06/06/2020   per pt had positive coivd test done at [facility] in [place] w/ [place] [facility],  had mild symptoms and exacerbation of asthma,  all resolved per pt (pt pulmonology provider states in office note in epic 07-11-2020 dx 06-06-2020)   History of acute respiratory failure 08/17/2019   hypoxic and hypercapnic, admission in epic   History of cor pulmonale 08/17/2019   Hypertension    OSA on CPAP    followed by dr a. neda---  moderate osa per study 09-30-2019 in epic   Right knee meniscal tear    Severe persistent asthma [facility])    pulmonology---- dr a. neda   Wears glasses    Wears hearing aid in both ears     Patient Active Problem List   Diagnosis Date Noted   Insomnia 08/27/2021   COVID-19 07/11/2020   Severe persistent asthma (HCC) 02/14/2020   OSA (obstructive sleep apnea) 10/18/2019   Acute on chronic respiratory failure with hypoxia and hypercapnia (HCC)    Acute respiratory failure with hypoxia (HCC) 08/17/2019   Obesity 08/17/2019   Essential hypertension 08/17/2019    Past Surgical History:  Procedure Laterality Date   KNEE ARTHROSCOPY WITH MEDIAL MENISECTOMY Right 08/28/2020   Procedure: KNEE ARTHROSCOPY WITH PARTIAL MEDIAL  MENISECTOMY;  Surgeon: Sharl Selinda Dover, MD;  Location: [facility];  Service: Orthopedics;  Laterality: Right;   NO PAST SURGERIES     WISDOM TOOTH EXTRACTION  age 15       Home Medications    Prior to Admission medications  Medication Sig Start Date End Date Taking? Authorizing Provider  amoxicillin -clavulanate (AUGMENTIN ) 875-125 MG tablet Take 1 tablet by mouth every 12 (twelve) hours for 5 days. 06/23/24 06/28/24 Yes Deepti Gunawan E, PA-C  azithromycin  (ZITHROMAX ) 250 MG tablet Take 500mg  PO daily x1d and then 250mg  daily x4 days 06/23/24  Yes Weston Fulco E, PA-C  albuterol  (VENTOLIN  HFA) 108 (90 Base) MCG/ACT inhaler Inhale 2 puffs into the lungs every 6 (six) hours as needed for wheezing or shortness of breath. 08/27/21   Parrett, Madelin RAMAN, NP  amLODipine  (NORVASC ) 10 MG tablet Take 10 mg by mouth daily. 08/11/19   [provider]  aspirin  EC 81 MG EC tablet Take 1 tablet (81 mg total) by mouth daily. 08/23/19   Regalado, Belkys A, MD  BREZTRI  AEROSPHERE 160-9-4.8 MCG/ACT AERO INHALE 2 PUFFS BY MOUTH TWICE A DAY 10/06/22   Parrett, Tammy S, NP  eszopiclone  (LUNESTA ) 2 MG TABS tablet Take 1 tablet (2 mg total) by mouth at bedtime as needed for sleep. Take immediately before bedtime 08/27/21   Parrett, Madelin RAMAN, NP  furosemide  (LASIX ) 20 MG tablet Take 1 tablet (20 mg total) by mouth daily. 08/23/19   Regalado, Belkys A, MD  HYDROcodone -acetaminophen  (NORCO) 7.5-325 MG tablet Take 1 tablet by mouth every 4 (four) hours as needed for moderate pain. 08/28/20   Sharl Selinda Dover, MD  ipratropium-albuterol  (DUONEB) 0.5-2.5 (3) MG/3ML SOLN Take 3 mLs by nebulization every 6 (six) hours as needed. 08/27/21   Parrett, Madelin RAMAN, NP  montelukast  (SINGULAIR ) 10 MG tablet TAKE ONE TABLET BY MOUTH EVERY NIGHT AT BEDTIME 09/02/21   Parrett, Tammy S, NP  ondansetron  (ZOFRAN  ODT) 4 MG disintegrating tablet Take 1 tablet (4 mg total) by mouth every 8 (eight) hours as needed for nausea or  vomiting. 08/28/20   Sharl Selinda Dover, MD  promethazine -dextromethorphan (PROMETHAZINE -DM) 6.25-15 MG/5ML syrup Take 5 mLs by mouth every 8 (eight) hours as needed for cough. 05/29/24   Teresa Almarie LABOR, PA-C    Family History Family History  Problem Relation Age of Onset   Asthma Father     Social History Social History[1]   Allergies   Patient has no known allergies.   Review of Systems Review of Systems  Constitutional:  Negative for chills and fever.  Respiratory:  Positive for cough. Negative for chest tightness, shortness of breath and wheezing.   Cardiovascular:  Negative for chest pain.     Physical Exam Triage Vital Signs ED Triage Vitals  Encounter Vitals Group     BP 06/23/24 1410 131/78     Girls Systolic BP Percentile --      Girls Diastolic BP Percentile --      Boys Systolic BP Percentile --      Boys Diastolic BP Percentile --      Pulse Rate 06/23/24 1410 (!) 105     Resp 06/23/24 1410 18     Temp 06/23/24 1410 98.3 F (36.8 C)     Temp Source 06/23/24 1410 Oral     SpO2 06/23/24 1410 94 %     Weight --      Height --      Head Circumference --      Peak Flow --      Pain Score 06/23/24 1407 0     Pain Loc --      Pain Education --      Exclude from Growth Chart --    No data found.  Updated Vital Signs BP 131/78 (BP Location: Right Arm)   Pulse (!) 105   Temp 98.3 F (36.8 C) (Oral)   Resp 18   SpO2 94%   Visual Acuity Right Eye Distance:   Left Eye Distance:   Bilateral Distance:    Right Eye Near:   Left Eye Near:    Bilateral Near:     Physical Exam Vitals reviewed.  Constitutional:      General: He is awake.     Appearance: Normal appearance. He is well-developed and well-groomed.  HENT:     Head: Normocephalic and atraumatic.  Eyes:     Extraocular Movements: Extraocular movements intact.     Conjunctiva/sclera: Conjunctivae normal.  Cardiovascular:     Rate and Rhythm: Normal rate and regular rhythm.     Heart  sounds: Normal heart sounds. No murmur heard.    No friction rub. No gallop.  Pulmonary:     Effort: Pulmonary effort is normal.     Breath sounds: No decreased air movement. Examination of the right-middle field reveals wheezing. Examination of the right-lower field  reveals wheezing. Wheezing present. No decreased breath sounds, rhonchi or rales.  Musculoskeletal:     Cervical back: Normal range of motion.  Neurological:     Mental Status: He is alert and oriented to person, place, and time.  Psychiatric:        Attention and Perception: Attention normal.        Mood and Affect: Mood normal.        Speech: Speech normal.        Behavior: Behavior normal. Behavior is cooperative.      [name] Treatments / Results  Labs (all labs ordered are listed, but only abnormal results are displayed) Labs Reviewed - No data to display  EKG   Radiology DG Chest 2 View Result Date: 06/23/2024 CLINICAL DATA:  Follow-up for pneumonia. EXAM: CHEST - 2 VIEW COMPARISON:  05/29/2024. FINDINGS: Stable cardiomediastinal contours. Decreased right upper lobe airspace disease. No new focal airspace consolidation. No pleural effusion or pneumothorax. No acute osseous abnormality. IMPRESSION: Decreased right upper lobe airspace disease, compatible with pneumonia. No new focal consolidation. Electronically Signed   By: Harrietta Sherry M.D.   On: 06/23/2024 15:17    Procedures Procedures (including critical care time)  Medications Ordered in [name] Medications - No data to display  Initial Impression / Assessment and Plan / [name] Course  I have reviewed the triage vital signs and the nursing notes.  Pertinent labs & imaging results that were available during my care of the patient were reviewed by me and considered in my medical decision making (see chart for details).      Final Clinical Impressions(s) / [name] Diagnoses   Final diagnoses:  Follow-up exam  Pneumonia of right upper lobe due to infectious  organism   Patient presents today for follow-up after he was diagnosed with pneumonia on 05/29/2024.  He is here for repeat chest x-ray to ensure resolution.  Physical exam reveals some lingering wheezes particularly on the right side and oxygen saturation is 94% with elevated pulse rate.  Repeat chest x-ray shows significant improvement of right upper lobe opacity that was seen on previous chest x-ray but there appears to be a lingering, small opacity in the area that concerns me for persistent infection.  Will repeat a 5-day course of Augmentin  and Z-Pak to help with resolution.  Recommend follow-up for repeat chest x-ray in 3 to 4 weeks to ensure adequate management.  Reviewed with patient that if another x-ray shows persistent opacity he should probably follow-up with PCP for further evaluation as this may correlate with the neoplasm versus infectious process.    Discharge Instructions      You were seen today for follow-up following a recent pneumonia diagnosis.  Your chest x-ray does look a lot better but your oxygen saturation is still below 95% and there is still some opacity in your x-ray that makes me concerned that your infection has not completely resolved.  I am going to send in another 5-day course of the Augmentin  and azithromycin  that you were taking before this to help fully resolve your symptoms.  I do recommend that you follow-up either here or with your primary care provider in 3 to 4 weeks for repeat chest x-ray to make sure that everything has resolved appropriately.  Please continue to use your breathing treatments and inhalers as directed.     ED Prescriptions     Medication Sig Dispense Auth. Provider   amoxicillin -clavulanate (AUGMENTIN ) 875-125 MG tablet Take 1 tablet by mouth every 12 (  twelve) hours for 5 days. 10 tablet Makira Holleman E, PA-C   azithromycin  (ZITHROMAX ) 250 MG tablet Take 500mg  PO daily x1d and then 250mg  daily x4 days 6 each Rotha Cassels E, PA-C      PDMP  not reviewed this encounter.     [1]  Social History Tobacco Use   Smoking status: Former    Current packs/day: 0.00    Average packs/day: 1 pack/day for 8.0 years (8.0 ttl pk-yrs)    Types: Cigarettes    Start date: 05/26/2001    Quit date: 05/26/2009    Years since quitting: 15.0   Smokeless tobacco: Never  Vaping Use   Vaping status: Never Used  Substance Use Topics   Alcohol use: Yes   Drug use: Never     Elizibeth Breau, Rocky BRAVO, PA-C 06/23/24 1524  "

## 2024-06-23 NOTE — Discharge Instructions (Addendum)
 You were seen today for follow-up following a recent pneumonia diagnosis.  Your chest x-ray does look a lot better but your oxygen saturation is still below 95% and there is still some opacity in your x-ray that makes me concerned that your infection has not completely resolved.  I am going to send in another 5-day course of the Augmentin  and azithromycin  that you were taking before this to help fully resolve your symptoms.  I do recommend that you follow-up either here or with your primary care provider in 3 to 4 weeks for repeat chest x-ray to make sure that everything has resolved appropriately.  Please continue to use your breathing treatments and inhalers as directed.

## 2024-06-23 NOTE — ED Triage Notes (Addendum)
 Pt presents to urgent care today for repeat chest x-ray. Was seen here at [facility] on 1/4 and dx with pneumonia. Able to finish prescribed medications. States his sxs have improved. Denies pain. Wheezing + cough noted in triage. HR 105.
